# Patient Record
Sex: Male | Born: 1985 | Race: Black or African American | Hispanic: No | Marital: Single | Smoking: Current every day smoker
Health system: Southern US, Community
[De-identification: ages and names within clinical notes are randomized; demographics above are authoritative.]

## PROBLEM LIST (undated history)

## (undated) DIAGNOSIS — S065X9A Traumatic subdural hemorrhage with loss of consciousness of unspecified duration, initial encounter: Secondary | ICD-10-CM

## (undated) DIAGNOSIS — M868X8 Other osteomyelitis, other site: Secondary | ICD-10-CM

## (undated) DIAGNOSIS — F1020 Alcohol dependence, uncomplicated: Secondary | ICD-10-CM

## (undated) DIAGNOSIS — R569 Unspecified convulsions: Secondary | ICD-10-CM

---
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
## 2017-04-20 HISTORY — PX: OTHER SURGICAL HISTORY: SHX169

## 2017-08-18 DIAGNOSIS — M868X8 Other osteomyelitis, other site: Secondary | ICD-10-CM

## 2017-08-18 HISTORY — DX: Other osteomyelitis, other site: M86.8X8

## 2017-09-18 DIAGNOSIS — S065XAA Traumatic subdural hemorrhage with loss of consciousness status unknown, initial encounter: Secondary | ICD-10-CM

## 2017-09-18 DIAGNOSIS — S065X9A Traumatic subdural hemorrhage with loss of consciousness of unspecified duration, initial encounter: Secondary | ICD-10-CM

## 2017-09-18 HISTORY — DX: Traumatic subdural hemorrhage with loss of consciousness of unspecified duration, initial encounter: S06.5X9A

## 2017-09-18 HISTORY — DX: Traumatic subdural hemorrhage with loss of consciousness status unknown, initial encounter: S06.5XAA

## 2017-10-28 DIAGNOSIS — F4310 Post-traumatic stress disorder, unspecified: Secondary | ICD-10-CM | POA: Diagnosis present

## 2017-10-28 DIAGNOSIS — F1020 Alcohol dependence, uncomplicated: Secondary | ICD-10-CM | POA: Insufficient documentation

## 2017-10-28 DIAGNOSIS — G4700 Insomnia, unspecified: Secondary | ICD-10-CM | POA: Insufficient documentation

## 2017-10-28 DIAGNOSIS — R519 Headache, unspecified: Secondary | ICD-10-CM | POA: Insufficient documentation

## 2017-10-28 DIAGNOSIS — F411 Generalized anxiety disorder: Secondary | ICD-10-CM | POA: Diagnosis present

## 2017-10-28 DIAGNOSIS — R569 Unspecified convulsions: Secondary | ICD-10-CM | POA: Insufficient documentation

## 2017-11-26 DIAGNOSIS — D500 Iron deficiency anemia secondary to blood loss (chronic): Secondary | ICD-10-CM | POA: Insufficient documentation

## 2017-11-26 DIAGNOSIS — S065XAA Traumatic subdural hemorrhage with loss of consciousness status unknown, initial encounter: Secondary | ICD-10-CM | POA: Insufficient documentation

## 2017-11-26 DIAGNOSIS — S065X9A Traumatic subdural hemorrhage with loss of consciousness of unspecified duration, initial encounter: Secondary | ICD-10-CM | POA: Insufficient documentation

## 2018-02-28 DIAGNOSIS — F3341 Major depressive disorder, recurrent, in partial remission: Secondary | ICD-10-CM | POA: Insufficient documentation

## 2018-09-13 ENCOUNTER — Encounter (HOSPITAL_COMMUNITY): Payer: Self-pay

## 2018-09-13 ENCOUNTER — Other Ambulatory Visit: Payer: Self-pay

## 2018-09-13 ENCOUNTER — Emergency Department (HOSPITAL_COMMUNITY)
Admission: EM | Admit: 2018-09-13 | Discharge: 2018-09-13 | Discharge status: Against Medical Advice | Payer: 59 | Attending: Emergency Medicine | Admitting: Emergency Medicine

## 2018-09-13 DIAGNOSIS — Z5321 Procedure and treatment not carried out due to patient leaving prior to being seen by health care provider: Secondary | ICD-10-CM | POA: Insufficient documentation

## 2018-09-13 DIAGNOSIS — F1020 Alcohol dependence, uncomplicated: Secondary | ICD-10-CM | POA: Diagnosis present

## 2018-09-13 HISTORY — DX: Alcohol dependence, uncomplicated: F10.20

## 2018-09-13 NOTE — ED Notes (Signed)
Called patient again, no answer

## 2018-09-13 NOTE — ED Notes (Signed)
Called for updating vitals x3 no response.

## 2018-09-13 NOTE — ED Triage Notes (Signed)
Pt arrives POV for eval of ETOH detox. Pt reports he was clean for 4 mos, relapsed last Thursday and has been drinking a handle of whiskey a day. Pt reports hx of same, denies SI/HI, states he just wishes to get clean again. Steady gait, NARD in triage

## 2018-09-14 ENCOUNTER — Other Ambulatory Visit: Payer: Self-pay

## 2018-09-14 ENCOUNTER — Emergency Department (HOSPITAL_COMMUNITY)
Admission: EM | Admit: 2018-09-14 | Discharge: 2018-09-14 | Disposition: A | Discharge status: Home / Self Care | Payer: 59 | Attending: Emergency Medicine | Admitting: Emergency Medicine

## 2018-09-14 ENCOUNTER — Encounter (HOSPITAL_COMMUNITY): Payer: Self-pay

## 2018-09-14 DIAGNOSIS — F17210 Nicotine dependence, cigarettes, uncomplicated: Secondary | ICD-10-CM | POA: Diagnosis not present

## 2018-09-14 DIAGNOSIS — F1010 Alcohol abuse, uncomplicated: Secondary | ICD-10-CM | POA: Insufficient documentation

## 2018-09-14 HISTORY — DX: Unspecified convulsions: R56.9

## 2018-09-14 MED ORDER — ONDANSETRON 4 MG PO TBDP
4.0000 mg | ORAL_TABLET | Freq: Once | ORAL | Status: AC
  Administered 2018-09-14: 4 mg via ORAL
  Filled 2018-09-14: qty 1

## 2018-09-14 MED ORDER — FAMOTIDINE 20 MG PO TABS: 40.0000 mg | ORAL_TABLET | Freq: Two times a day (BID) | ORAL | 0 refills | Status: DC

## 2018-09-14 MED ORDER — CHLORDIAZEPOXIDE HCL 25 MG PO CAPS: ORAL_CAPSULE | ORAL | 0 refills | Status: DC

## 2018-09-14 MED ORDER — FAMOTIDINE 20 MG PO TABS
40.0000 mg | ORAL_TABLET | Freq: Once | ORAL | Status: AC
  Administered 2018-09-14: 13:00:00 40 mg via ORAL
  Filled 2018-09-14: qty 2

## 2018-09-14 MED ORDER — ONDANSETRON 4 MG PO TBDP: 4.0000 mg | ORAL_TABLET | ORAL | 0 refills | Status: DC | PRN

## 2018-09-14 NOTE — ED Triage Notes (Signed)
Patient reports that he was in recovery from alcohol and started drinking x 3 days. Patient reports that he drank 5-6 bottles of [place] rose and bottle of whiskey within that 3 day period. patient reports that he began having pain on the right side of his head and began drinking to get rid of the pain. Patient reports that he has had a seizure when withdrawing from alcohol Patient reports that he feels like he can hear his heart throbbing and hearing sounds.

## 2018-09-14 NOTE — ED Provider Notes (Signed)
[name]-EMERGENCY DEPT Provider Note   CSN: [id number] Arrival date & time: 09/14/18  1229    History   Chief Complaint Chief Complaint  Patient presents with  . Alcohol Intoxication    HPI Eric Clark is a 33 y.o. male.     HPI Patient reports he had been sober for several years after successful treatment at [facility].  He reports after all of the quarantine and stress around recent events, he started drinking very heavily about 3 to 5 days ago.  He reports that he has history of getting alcohol withdrawal symptoms.  He reports today he just did not feel like he could keep doing this with the drinking.  He reports that he stopped but now he is got dry heaves and feels extremely nauseated.  Denies he has abdominal pain but it is hard for him to eat or drink right now.  He reports he feels kind of tremulous and feeling as if he might go on to have a seizure.  He reports he had an old prescription for Keppra and he took 1 today.  We tried to clarify the Keppra prescription because he indicates it was because he had alcohol withdrawal seizures.  (I suspect this was after his subdural hematoma as seizure prophylaxis rather than alcohol withdrawal seizure prophylaxis.  He has not been taking it regularly for quite some time and brought out an older prescription because he felt like he might be going into withdrawal and would be susceptible to getting a seizure).  Patient denies he is having any actual abdominal pain.  He reports this just intense nausea.  He denies he is having headache or confusion.  Patient denies any suicidal or homicidal thoughts.  He reports however, he is worried that he will end up killing himself by alcohol if he keeps drinking.  He feels like he can quit again on his own because he has not been drinking that long again and this was a relapsed due to situational problems. Past Medical History:  Diagnosis Date  . Alcoholism (HCC)   .  Seizures (HCC)   . Subdural hematoma (HCC)     There are no active problems to display for this patient.   Past Surgical History:  Procedure Laterality Date  . subdural hematoma removed          Home Medications    Prior to Admission medications   Medication Sig Start Date End Date Taking? Authorizing Provider  chlordiazePOXIDE (LIBRIUM) 25 MG capsule  PO TID x 1D, then 25-50mg  PO BID X 1D, then 25-50mg  PO QD X 1D 09/14/18   Arby Barrette, MD  famotidine (PEPCID) 20 MG tablet Take 2 tablets (40 mg total) by mouth 2 (two) times daily. 09/14/18   Arby Barrette, MD  ondansetron (ZOFRAN ODT) 4 MG disintegrating tablet Take 1 tablet (4 mg total) by mouth every 4 (four) hours as needed for nausea or vomiting. 09/14/18   Arby Barrette, MD    Family History Family History  Problem Relation Age of Onset  . Diabetes Mother   . Hypertension Father     Social History Social History   Tobacco Use  . Smoking status: Current Every Day Smoker    Packs/day: 0.50    Types: Cigarettes  . Smokeless tobacco: Never Used  Substance Use Topics  . Alcohol use: Yes  . Drug use: Not Currently     Allergies   Patient has no known allergies.   Review of  Systems Review of Systems 10 Systems reviewed and are negative for acute change except as noted in the HPI.   Physical Exam Updated Vital Signs BP (!) 127/100   Pulse (!) 102   Temp 98.2 F (36.8 C) (Oral)   Resp 20   Ht 5\' 11"  (1.803 m)   Wt 63.5 kg   SpO2 98%   BMI 19.53 kg/m   Physical Exam Constitutional:      Comments: Patient is alert and appropriate.  He is sitting in the chair in the room.  He is not agitated or tremulous.  Respirations are, nonlabored.  HENT:     Head: Normocephalic and atraumatic.     Nose: Nose normal.     Mouth/Throat:     Mouth: Mucous membranes are moist.     Pharynx: Oropharynx is clear.  Eyes:     Extraocular Movements: Extraocular movements intact.     Pupils: Pupils are equal,  round, and reactive to light.  Neck:     Musculoskeletal: Neck supple.  Cardiovascular:     Rate and Rhythm: Normal rate and regular rhythm.     Pulses: Normal pulses.  Pulmonary:     Effort: Pulmonary effort is normal.     Breath sounds: Normal breath sounds.  Abdominal:     General: There is no distension.     Palpations: Abdomen is soft.     Tenderness: There is no abdominal tenderness. There is no guarding.  Musculoskeletal: Normal range of motion.        General: No swelling or tenderness.     Right lower leg: No edema.     Left lower leg: No edema.  Skin:    General: Skin is warm and dry.  Neurological:     Comments: Patient speech is normal.  It is clear with normal content.  He is calm in appearance.  Patient does not seem agitated and is not exhibiting a tremor.  No tremor with outstretched hands.  His movements are coordinated purposeful and symmetric.  Psychiatric:        Mood and Affect: Mood normal.      ED Treatments / Results  Labs (all labs ordered are listed, but only abnormal results are displayed) Labs Reviewed - No data to display  EKG None  Radiology No results found.  Procedures Procedures (including critical care time)  Medications Ordered in ED Medications  famotidine (PEPCID) tablet 40 mg (has no administration in time range)  ondansetron (ZOFRAN-ODT) disintegrating tablet 4 mg (has no administration in time range)     Initial Impression / Assessment and Plan / ED Course  I have reviewed the triage vital signs and the nursing notes.  Pertinent labs & imaging results that were available during my care of the patient were reviewed by me and considered in my medical decision making (see chart for details).       Patient is alert and appropriate.  He is not showing signs of active delirium tremens.  He reports however history of seizures with alcohol withdrawal.  Patient intends to quit drinking again.  He had a several day binge relapse  after long period of sobriety.  At this time, he appears stable to continue outpatient treatment.  He reports he has taken Librium before.  He is instructed on taking Pepcid and Zofran for nausea and suspected gastritis.  Patient is encouraged to use the resource guide to get formal treatment again and also he is advised to follow-up with his  PCP, which he reports he will do.  Return precautions reviewed.  Final Clinical Impressions(s) / ED Diagnoses   Final diagnoses:  ETOH abuse    ED Discharge Orders         Ordered    famotidine (PEPCID) 20 MG tablet  2 times daily     09/14/18 1313    chlordiazePOXIDE (LIBRIUM) 25 MG capsule     09/14/18 1313    ondansetron (ZOFRAN ODT) 4 MG disintegrating tablet  Every 4 hours PRN     09/14/18 1313           Arby Barrette, MD 09/14/18 1322

## 2018-09-14 NOTE — ED Notes (Signed)
Bed: [facility]3 Expected date:  Expected time:  Means of arrival:  Comments: 

## 2018-10-10 ENCOUNTER — Emergency Department (HOSPITAL_COMMUNITY): Payer: 59

## 2018-10-10 ENCOUNTER — Emergency Department (HOSPITAL_COMMUNITY)
Admission: EM | Admit: 2018-10-10 | Discharge: 2018-10-10 | Disposition: A | Discharge status: Home / Self Care | Payer: 59 | Attending: Emergency Medicine | Admitting: Emergency Medicine

## 2018-10-10 ENCOUNTER — Other Ambulatory Visit: Payer: Self-pay

## 2018-10-10 ENCOUNTER — Encounter (HOSPITAL_COMMUNITY): Payer: Self-pay | Admitting: Emergency Medicine

## 2018-10-10 DIAGNOSIS — Z79899 Other long term (current) drug therapy: Secondary | ICD-10-CM | POA: Insufficient documentation

## 2018-10-10 DIAGNOSIS — R51 Headache: Secondary | ICD-10-CM | POA: Insufficient documentation

## 2018-10-10 DIAGNOSIS — Z8679 Personal history of other diseases of the circulatory system: Secondary | ICD-10-CM | POA: Diagnosis not present

## 2018-10-10 DIAGNOSIS — F17210 Nicotine dependence, cigarettes, uncomplicated: Secondary | ICD-10-CM | POA: Insufficient documentation

## 2018-10-10 DIAGNOSIS — R519 Headache, unspecified: Secondary | ICD-10-CM

## 2018-10-10 MED ORDER — SODIUM CHLORIDE 0.9 % IV BOLUS
1000.0000 mL | Freq: Once | INTRAVENOUS | Status: AC
  Administered 2018-10-10: 1000 mL via INTRAVENOUS

## 2018-10-10 MED ORDER — SODIUM CHLORIDE 0.9 % IV BOLUS
1000.0000 mL | Freq: Once | INTRAVENOUS | Status: AC
  Administered 2018-10-10: 11:00:00 1000 mL via INTRAVENOUS

## 2018-10-10 MED ORDER — KETOROLAC TROMETHAMINE 30 MG/ML IJ SOLN
30.0000 mg | Freq: Once | INTRAMUSCULAR | Status: AC
  Administered 2018-10-10: 30 mg via INTRAVENOUS
  Filled 2018-10-10: qty 1

## 2018-10-10 NOTE — ED Notes (Signed)
Patient verbalizes understanding of discharge instructions. Opportunity for questioning and answers were provided. Armband removed by staff, pt discharged from ED.  

## 2018-10-10 NOTE — Discharge Instructions (Addendum)
Increase your fluid intake.  You can take Motrin and Tylenol for the headache.  Your head CT did not show any abnormality at this time.

## 2018-10-10 NOTE — ED Triage Notes (Signed)
BIB EMS from home. Pt reports HA onset 0100 with nausea. Pt has hx of migraines & subdural hematoma. No neuro deficits.

## 2018-10-10 NOTE — ED Provider Notes (Signed)
[facility] EMERGENCY DEPARTMENT Provider Note   CSN: [id number] Arrival date & time: 10/10/18  0555     History   Chief Complaint Chief Complaint  Patient presents with  . Headache    HPI Arnie Clingenpeel is a 33 y.o. male.     HPI Patient presents to the emergency department with headache that started last night.  The patient states that he is an alcoholic and has been in recovery but then over the last few days has been drinking large quantities of very inexpensive wine.  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  The patient states that nothing seems make the condition better.  The patient states it feels like a pulling tightness towards the back of his scalp and neck area.  Patient states that he did not take any medications prior to arrival for his symptoms.  Patient states that he has had headaches similar in the past.  The patient was concerned that this could be related to the heavy alcohol consumption. Past Medical History:  Diagnosis Date  . Alcoholism ([place])   . Seizures ([place])   . Subdural hematoma (HCC)     There are no active problems to display for this patient.   Past Surgical History:  Procedure Laterality Date  . subdural hematoma removed          Home Medications    Prior to Admission medications   Medication Sig Start Date End Date Taking? Authorizing Provider  chlordiazePOXIDE (LIBRIUM) 25 MG capsule 50mg  PO TID x 1D, then 25-50mg  PO BID X 1D, then 25-50mg  PO QD X 1D 09/14/18   Charlesetta Shanks, MD  famotidine (PEPCID) 20 MG tablet Take 2 tablets (40 mg total) by mouth 2 (two) times daily. 09/14/18   Charlesetta Shanks, MD  ondansetron (ZOFRAN ODT) 4 MG disintegrating tablet Take 1 tablet (4 mg total) by mouth every 4 (four) hours as needed for nausea or vomiting. 09/14/18   Charlesetta Shanks, MD    Family History Family  History  Problem Relation Age of Onset  . Diabetes Mother   . Hypertension Father     Social History Social History   Tobacco Use  . Smoking status: Current Every Day Smoker    Packs/day: 0.50    Types: Cigarettes  . Smokeless tobacco: Never Used  Substance Use Topics  . Alcohol use: Yes  . Drug use: Not Currently     Allergies   Patient has no known allergies.   Review of Systems Review of Systems All other systems negative except as documented in the HPI. All pertinent positives and negatives as reviewed in the HPI.  Physical Exam Updated Vital Signs BP (!) 121/94   Pulse 68   Temp 98 F (36.7 C) (Oral)   Resp 16   Ht 5\' 11"  (1.803 m)   Wt 64 kg   SpO2 99%   BMI 19.68 kg/m   Physical Exam Vitals signs and nursing note reviewed.  Constitutional:      General: He is not in acute distress.    Appearance: He is well-developed.  HENT:     Head: Normocephalic and atraumatic.  Eyes:     Pupils: Pupils are equal, round, and reactive to light.  Neck:     Musculoskeletal: Normal range of motion and neck supple.  Cardiovascular:     Rate and Rhythm:  Normal rate and regular rhythm.     Heart sounds: Normal heart sounds. No murmur. No friction rub. No gallop.   Pulmonary:     Effort: Pulmonary effort is normal. No respiratory distress.     Breath sounds: Normal breath sounds. No wheezing.  Abdominal:     General: Bowel sounds are normal. There is no distension.     Palpations: Abdomen is soft.     Tenderness: There is no abdominal tenderness.  Skin:    General: Skin is warm and dry.     Capillary Refill: Capillary refill takes less than 2 seconds.     Findings: No erythema or rash.  Neurological:     Mental Status: He is alert and oriented to person, place, and time.     GCS: GCS eye subscore is 4. GCS verbal subscore is 5. GCS motor subscore is 6.     Sensory: No sensory deficit.     Motor: No weakness or abnormal muscle tone.     Coordination:  Coordination normal.     Gait: Gait normal.  Psychiatric:        Behavior: Behavior normal.      ED Treatments / Results  Labs (all labs ordered are listed, but only abnormal results are displayed) Labs Reviewed - No data to display  EKG    Radiology Ct Head Wo Contrast  Result Date: 10/10/2018 CLINICAL DATA:  33 year old male with headache and nausea since 0100 hours. History of subdural hematoma. EXAM: CT HEAD WITHOUT CONTRAST TECHNIQUE: Contiguous axial images were obtained from the base of the skull through the vertex without intravenous contrast. COMPARISON:  None. FINDINGS: Brain: Relatively normal cerebral volume. No encephalomalacia identified underlying the right side craniotomy. No midline shift, ventriculomegaly, mass effect, evidence of mass lesion, intracranial hemorrhage or evidence of cortically based acute infarction. Gray-white matter differentiation is within normal limits throughout the brain. Vascular: Minimal Calcified atherosclerosis at the skull base. No suspicious intracranial vascular hyperdensity. Skull: Previous right side craniotomy. No acute osseous abnormality identified. Sinuses/Orbits: Visualized paranasal sinuses and mastoids are clear. Other: Visualized orbit soft tissues are within normal limits. No acute scalp soft tissue finding. IMPRESSION: Previous right side craniotomy. Otherwise negative noncontrast head CT. Electronically Signed   By: Odessa FlemingH  Hall M.D.   On: 10/10/2018 08:01    Procedures Procedures (including critical care time)  Medications Ordered in ED Medications  sodium chloride 0.9 % bolus 1,000 mL (1,000 mLs Intravenous New Bag/Given 10/10/18 0927)  ketorolac (TORADOL) 30 MG/ML injection 30 mg (30 mg Intravenous Given 10/10/18 [id number])     Initial Impression / Assessment and Plan / ED Course  I have reviewed the triage vital signs and the nursing notes.  Pertinent labs & imaging results that were available during my care of the patient were  reviewed by me and considered in my medical decision making (see chart for details).        Patient has no neurological abnormalities on examination.  The patient's headache could be multifactorial but most likely is related to the heavy alcohol consumption.  The patient has been treated with IV fluids along with Toradol here in the emergency department.  Patient is advised to return here for any worsening his condition.  The patient's head CT did not show any abnormality at this time.  Final Clinical Impressions(s) / ED Diagnoses   Final diagnoses:  None    ED Discharge Orders    None       Mairead Schwarzkopf, Cristal Deerhristopher, PA-C  10/10/18 1143    Arby BarrettePfeiffer, Marcy, MD 10/19/18 [id number]

## 2018-10-19 ENCOUNTER — Emergency Department (HOSPITAL_COMMUNITY): Payer: 59

## 2018-10-19 ENCOUNTER — Other Ambulatory Visit: Payer: Self-pay

## 2018-10-19 ENCOUNTER — Emergency Department (HOSPITAL_COMMUNITY)
Admission: EM | Admit: 2018-10-19 | Discharge: 2018-10-19 | Disposition: A | Discharge status: Home / Self Care | Payer: 59 | Attending: Emergency Medicine | Admitting: Emergency Medicine

## 2018-10-19 ENCOUNTER — Encounter (HOSPITAL_COMMUNITY): Payer: Self-pay | Admitting: *Deleted

## 2018-10-19 DIAGNOSIS — Z8669 Personal history of other diseases of the nervous system and sense organs: Secondary | ICD-10-CM | POA: Diagnosis not present

## 2018-10-19 DIAGNOSIS — F1010 Alcohol abuse, uncomplicated: Secondary | ICD-10-CM | POA: Diagnosis not present

## 2018-10-19 DIAGNOSIS — R51 Headache: Secondary | ICD-10-CM | POA: Diagnosis not present

## 2018-10-19 DIAGNOSIS — F17210 Nicotine dependence, cigarettes, uncomplicated: Secondary | ICD-10-CM | POA: Diagnosis not present

## 2018-10-19 DIAGNOSIS — R519 Headache, unspecified: Secondary | ICD-10-CM

## 2018-10-19 DIAGNOSIS — Z79899 Other long term (current) drug therapy: Secondary | ICD-10-CM | POA: Insufficient documentation

## 2018-10-19 LAB — BASIC METABOLIC PANEL
Anion gap: 9 (ref 5–15)
BUN: 5 mg/dL — ABNORMAL LOW (ref 6–20)
CO2: 25 mmol/L (ref 22–32)
Calcium: 9.1 mg/dL (ref 8.9–10.3)
Chloride: 106 mmol/L (ref 98–111)
Creatinine, Ser: 0.76 mg/dL (ref 0.61–1.24)
GFR calc Af Amer: >60 mL/min (ref >60)
GFR calc non Af Amer: >60 mL/min (ref >60)
Glucose, Bld: 87 mg/dL (ref 70–99)
Potassium: 3.7 mmol/L (ref 3.5–5.1)
Sodium: 140 mmol/L (ref 135–145)

## 2018-10-19 LAB — CBC WITH DIFFERENTIAL/PLATELET
Abs Immature Granulocytes: 0.02 10*3/uL (ref 0.00–0.07)
Basophils Absolute: 0.1 10*3/uL (ref 0.0–0.1)
Basophils Relative: 1 %
Eosinophils Absolute: 0 10*3/uL (ref 0.0–0.5)
Eosinophils Relative: 1 %
HCT: 41.1 % (ref 39.0–52.0)
Hemoglobin: 14.4 g/dL (ref 13.0–17.0)
Immature Granulocytes: 0 %
Lymphocytes Relative: 38 %
Lymphs Abs: 2 10*3/uL (ref 0.7–4.0)
MCH: 28.4 pg (ref 26.0–34.0)
MCHC: 35 g/dL (ref 30.0–36.0)
MCV: 81.1 fL (ref 80.0–100.0)
Monocytes Absolute: 0.5 10*3/uL (ref 0.1–1.0)
Monocytes Relative: 9 %
Neutro Abs: 2.8 10*3/uL (ref 1.7–7.7)
Neutrophils Relative %: 51 %
Platelets: 207 10*3/uL (ref 150–400)
RBC: 5.07 MIL/uL (ref 4.22–5.81)
RDW: 18 % — ABNORMAL HIGH (ref 11.5–15.5)
WBC: 5.4 10*3/uL (ref 4.0–10.5)
nRBC: 0 % (ref 0.0–0.2)

## 2018-10-19 MED ORDER — DIPHENHYDRAMINE HCL 50 MG/ML IJ SOLN
12.5000 mg | Freq: Once | INTRAMUSCULAR | Status: AC
  Administered 2018-10-19: 12.5 mg via INTRAVENOUS
  Filled 2018-10-19: qty 1

## 2018-10-19 MED ORDER — METOCLOPRAMIDE HCL 5 MG/ML IJ SOLN
10.0000 mg | Freq: Once | INTRAMUSCULAR | Status: AC
  Administered 2018-10-19: 10 mg via INTRAVENOUS
  Filled 2018-10-19: qty 2

## 2018-10-19 MED ORDER — KETOROLAC TROMETHAMINE 30 MG/ML IJ SOLN
30.0000 mg | Freq: Once | INTRAMUSCULAR | Status: AC
  Administered 2018-10-19: 30 mg via INTRAVENOUS
  Filled 2018-10-19: qty 1

## 2018-10-19 MED ORDER — SODIUM CHLORIDE 0.9 % IV BOLUS
1000.0000 mL | Freq: Once | INTRAVENOUS | Status: AC
  Administered 2018-10-19: 1000 mL via INTRAVENOUS

## 2018-10-19 NOTE — ED Notes (Signed)
Patient transported to CT 

## 2018-10-19 NOTE — ED Notes (Signed)
ED Provider at bedside. 

## 2018-10-19 NOTE — ED Triage Notes (Signed)
Pt arrived by [facility] for headache. Has hx of migraines and subdural hematoma in past.  Having headache x 2 days with nausea. Has not taken any otc meds pta.

## 2018-10-19 NOTE — ED Provider Notes (Signed)
33 year old man history of subdural hematoma and alcohol abuse presents today with headache and discontinuation of alcohol since Monday.  Evaluation here reveals normal neurological exam.  He had a head CT performed that shows no evidence of new bleeding.  Vital signs are stable with heart rate of 67 and blood pressure on my exam of 102/60.  Discussed need for follow-up and patient states he has a neurology appointment in early August.  He also discussed alcohol withdrawal signs and symptoms and need for return.  Patient voices understanding of above and appears stable for discharge   Pattricia Boss, MD 10/19/18 [phone]

## 2018-10-19 NOTE — ED Provider Notes (Signed)
[facility] Provider Note   CSN: [id number] Arrival date & time: 10/19/18  1048  History   Chief Complaint Chief Complaint  Patient presents with  . Headache    HPI Eric Clark is a 33 y.o. male with past medical history significant for alcoholism, seizures, spontaneous subdural hematoma s/p craniotomy who presents for evaluation of headache.  Patient states he has had a headache x4 days.  Patient states he is recovering alcoholic however has been drinking alcohol over the last month.  States he drinks approximately 1 bottle of "cheap wine" a day.  Patient states he is not any alcohol to drink today.  Last alcohol use 24 hours ago.  Denies history of DTs or withdrawal seizures.  Patient states his history of seizures was due to his subarachnoid hemorrhage.  He is currently on Keppra.  He has not missed any doses.  States he did have a mechanical fall 2 days ago where he hit the right side of his head.  He is not sure if his headache started after his fall.  He denies sudden onset thunderclap headache.  Patient states overall since his craniotomy he has been suffering from migraine headaches as well as tension headaches.  He has not taken anything for his pain.  He rates his pain a 5/10.  Pain does not radiate.  Denies vision changes, unilateral weakness, slurred speech, chest pain, shortness of breath, abdominal pain, diarrhea, the patient, melena, hematochezia, hematemesis, cough, neck pain or neck stiffness, dysuria, dizziness, lightheadedness, syncope, tremors.  Patient states he cannot remember the last time he had a seizure however "it is been a long time.".  Patient states over the last month he is also had generalized fatigue.  Headache non-positional in nature.  No recent spinal infusions.  History obtained from patient and past medical records.  No interpreter was used.     HPI  Past Medical History:  Diagnosis Date  . Alcoholism (HCC)   .  Seizures (HCC)   . Subdural hematoma (HCC)     There are no active problems to display for this patient.   Past Surgical History:  Procedure Laterality Date  . subdural hematoma removed          Home Medications    Prior to Admission medications   Medication Sig Start Date End Date Taking? Authorizing Provider  chlordiazePOXIDE (LIBRIUM) 25 MG capsule 50mg  PO TID x 1D, then 25-50mg  PO BID X 1D, then 25-50mg  PO QD X 1D 09/14/18  Yes Pfeiffer, Lebron ConnersMarcy, MD  gabapentin (NEURONTIN) 300 MG capsule Take 300 mg by mouth 3 (three) times daily. 09/14/18  Yes [provider]  levETIRAcetam (KEPPRA) 500 MG tablet Take 500 mg by mouth 2 (two) times daily.   Yes [provider]  famotidine (PEPCID) 20 MG tablet Take 2 tablets (40 mg total) by mouth 2 (two) times daily. Patient not taking: Reported on 10/19/2018 09/14/18   Arby BarrettePfeiffer, Marcy, MD  ondansetron (ZOFRAN ODT) 4 MG disintegrating tablet Take 1 tablet (4 mg total) by mouth every 4 (four) hours as needed for nausea or vomiting. Patient not taking: Reported on 10/19/2018 09/14/18   Arby BarrettePfeiffer, Marcy, MD    Family History Family History  Problem Relation Age of Onset  . Diabetes Mother   . Hypertension Father     Social History Social History   Tobacco Use  . Smoking status: Current Every Day Smoker    Packs/day: 0.50    Types: Cigarettes  .  Smokeless tobacco: Never Used  Substance Use Topics  . Alcohol use: Yes  . Drug use: Not Currently     Allergies   Shellfish allergy and Sunflower oil   Review of Systems Review of Systems  Constitutional: Negative.   HENT: Negative.   Respiratory: Negative.   Cardiovascular: Negative.   Gastrointestinal: Negative.   Genitourinary: Negative.   Musculoskeletal: Negative.   Skin: Negative.   Neurological: Positive for headaches. Negative for dizziness, tremors, seizures, syncope, facial asymmetry, speech difficulty, weakness, light-headedness and numbness.  All other  systems reviewed and are negative.    Physical Exam Updated Vital Signs BP 123/85   Pulse 67   Temp 98.5 F (36.9 C) (Oral)   Resp 14   SpO2 100%   Physical Exam  Physical Exam  Constitutional: Pt is oriented to person, place, and time. Pt appears well-developed and well-nourished. No distress.  HENT:  Head: Normocephalic and atraumatic.  Mouth/Throat: Oropharynx is clear and moist.  Eyes: Conjunctivae and EOM are normal. Pupils are equal, round, and reactive to light. No scleral icterus.  No horizontal, vertical or rotational nystagmus  Neck: Normal range of motion. Neck supple.  Full active and passive ROM without pain No midline or paraspinal tenderness No nuchal rigidity or meningeal signs  Cardiovascular: Normal rate, regular rhythm and intact distal pulses.   Pulmonary/Chest: Effort normal and breath sounds normal. No respiratory distress. Pt has no wheezes. No rales.  Abdominal: Soft. Bowel sounds are normal. There is no tenderness. There is no rebound and no guarding.  Musculoskeletal: Normal range of motion.  Lymphadenopathy:    No cervical adenopathy.  Neurological: Pt. is alert and oriented to person, place, and time. He has normal reflexes. No cranial nerve deficit.  Exhibits normal muscle tone. Coordination normal.  Mental Status:  Alert, oriented, thought content appropriate. Speech fluent without evidence of aphasia. Able to follow 2 step commands without difficulty.  Cranial Nerves:  II:  Peripheral visual fields grossly normal, pupils equal, round, reactive to light III,IV, VI: ptosis not present, extra-ocular motions intact bilaterally  V,VII: smile symmetric, facial light touch sensation equal VIII: hearing grossly normal bilaterally  IX,X: midline uvula rise  XI: bilateral shoulder shrug equal and strong XII: midline tongue extension  Motor:  5/5 in upper and lower extremities bilaterally including strong and equal grip strength and dorsiflexion/plantar  flexion Sensory: Pinprick and light touch normal in all extremities.  Deep Tendon Reflexes: 2+ and symmetric  Cerebellar: normal finger-to-nose with bilateral upper extremities Gait: normal gait and balance CV: distal pulses palpable throughout   Skin: Skin is warm and dry. No rash noted. Pt is not diaphoretic.  Psychiatric: Pt has a normal mood and affect. Behavior is normal. Judgment and thought content normal.  Nursing note and vitals reviewed. ED Treatments / Results  Labs (all labs ordered are listed, but only abnormal results are displayed) Labs Reviewed  CBC WITH DIFFERENTIAL/PLATELET - Abnormal; Notable for the following components:      Result Value   RDW 18.0 (*)    All other components within normal limits  BASIC METABOLIC PANEL - Abnormal; Notable for the following components:   BUN 5 (*)    All other components within normal limits    EKG EKG Interpretation  Date/Time:  Wednesday October 19 2018 11:21:16 EDT Ventricular Rate:  73 PR Interval:    QRS Duration: 110 QT Interval:  367 QTC Calculation: 405 R Axis:   -12 Text Interpretation:  Normal sinus rhythm No  significant change since last tracing 10/10/2018 Confirmed by Margarita Grizzleay, Danielle [phone]([id number]) on 10/19/2018 11:26:12 AM Also confirmed by Margarita Grizzleay, Danielle [phone]), editor Elita QuickWatlington, [place] ([id number])  on 10/19/2018 4:06:47 PM   Radiology Ct Head Wo Contrast  Result Date: 10/19/2018 CLINICAL DATA:  Right-sided headache, history of subdural hemorrhage EXAM: CT HEAD WITHOUT CONTRAST TECHNIQUE: Contiguous axial images were obtained from the base of the skull through the vertex without intravenous contrast. COMPARISON:  10/10/2018 FINDINGS: Brain: No evidence of acute infarction, hemorrhage, hydrocephalus, extra-axial collection or mass lesion/mass effect. Vascular: No hyperdense vessel or unexpected calcification. Skull: Redemonstrated postoperative findings of prior right frontoparietal craniotomy. Negative for fracture or focal lesion.  Sinuses/Orbits: No acute finding. Other: None. IMPRESSION: No acute intracranial pathology. Redemonstrated postoperative findings of prior right frontoparietal craniotomy. Electronically Signed   By: Lauralyn PrimesAlex  Bibbey M.D.   On: 10/19/2018 13:03    Procedures Procedures (including critical care time)  Medications Ordered in ED Medications  sodium chloride 0.9 % bolus 1,000 mL (0 mLs Intravenous Stopped 10/19/18 1448)  ketorolac (TORADOL) 30 MG/ML injection 30 mg (30 mg Intravenous Given 10/19/18 1343)  metoCLOPramide (REGLAN) injection 10 mg (10 mg Intravenous Given 10/19/18 1344)  diphenhydrAMINE (BENADRYL) injection 12.5 mg (12.5 mg Intravenous Given 10/19/18 1343)     Initial Impression / Assessment and Plan / ED Course  I have reviewed the triage vital signs and the nursing notes.  Pertinent labs & imaging results that were available during my care of the patient were reviewed by me and considered in my medical decision making (see chart for details).  33 year old male appears otherwise well presents for evaluation of headache.  History of spontaneous subarachnoid hemorrhage s/p craniotomy.  Patient recovering alcoholic however is been drinking approximately 1 bottle of wine daily over the last month.  He denies history of DTs or withdrawal seizures.  Does have history of seizures which were related to a subarachnoid hemorrhage.  He takes Keppra.  Has not missed any doses.  Did have mechanical fall 4 days ago and hit the right side of his head.  He denies midline cervical or spinal tenderness palpation.  He has a normal musculoskeletal exam.  Normal neurologic he was at exam without neurologic deficits.  He has no dizziness, lightheadedness.  Has had some generalized fatigue over the last month.  Highly suspect fatigue is likely related to his recent alcohol use.  He does not appear in any alcohol withdrawal.  He is not any tachycardia, tachypnea or hypoxia.  No tremors.  Given history with recent fall  and spontaneous subarachnoid hemorrhage will obtain CT to rule out intracranial pathology.  Suspect patient's recurrent headache.  Obtain baseline labs to for electrolyte, anemia as cause of patient's fatigue.  Labs and Imaging personally reviewed--Labs without any acute findings, CT head negative, EKG without ST/T changes, no STEMI.  Presentation is like pts typical HA and non concerning for [facility] LPAH, ICH, Meningitis, or temporal arteritis. Pt is afebrile with no focal neuro deficits, nuchal rigidity, or change in vision. Pt is to follow up with PCP to discuss prophylactic medication.  Patient without any evidence of alcohol withdrawal currently.  Offered Librium taper at dc for possible alcohol withdrawal however patient declined.  The patient has been appropriately medically screened and/or stabilized in the ED. I have low suspicion for any other emergent medical condition which would require further screening, evaluation or treatment in the ED or require inpatient management. Patient is hemodynamically stable and in no acute distress.  Patient able to  ambulate in department prior to ED.  Evaluation does not show acute pathology that would require ongoing or additional emergent interventions while in the emergency department or further inpatient treatment.  I have discussed the diagnosis with the patient and answered all questions.  Pain is been managed while in the emergency department and patient has no further complaints prior to discharge.  Patient is comfortable with plan discussed in room and is stable for discharge at this time.  I have discussed strict return precautions for returning to the emergency department.  Patient was encouraged to follow-up with PCP/specialist refer to at discharge.     Final Clinical Impressions(s) / ED Diagnoses   Final diagnoses:  Acute nonintractable headache, unspecified headache type    ED Discharge Orders    None       Danett Palazzo A, PA-C 10/19/18 2028     Pattricia Boss, MD 10/20/18 1440

## 2018-10-19 NOTE — Discharge Instructions (Signed)
Follow Up with neurology for your recurrent headache.  Return to the ED for any new or worsening symptoms.

## 2018-10-24 ENCOUNTER — Other Ambulatory Visit: Payer: Self-pay

## 2018-10-24 ENCOUNTER — Encounter (HOSPITAL_COMMUNITY): Payer: Self-pay

## 2018-10-24 ENCOUNTER — Emergency Department (HOSPITAL_COMMUNITY)
Admission: EM | Admit: 2018-10-24 | Discharge: 2018-10-25 | Disposition: A | Discharge status: Other Acute Inpatient Hospital | Payer: 59 | Source: Home / Self Care | Attending: Emergency Medicine | Admitting: Emergency Medicine

## 2018-10-24 DIAGNOSIS — Z03818 Encounter for observation for suspected exposure to other biological agents ruled out: Secondary | ICD-10-CM | POA: Insufficient documentation

## 2018-10-24 DIAGNOSIS — F332 Major depressive disorder, recurrent severe without psychotic features: Secondary | ICD-10-CM | POA: Insufficient documentation

## 2018-10-24 DIAGNOSIS — F4310 Post-traumatic stress disorder, unspecified: Secondary | ICD-10-CM | POA: Insufficient documentation

## 2018-10-24 DIAGNOSIS — F1010 Alcohol abuse, uncomplicated: Secondary | ICD-10-CM

## 2018-10-24 DIAGNOSIS — F322 Major depressive disorder, single episode, severe without psychotic features: Secondary | ICD-10-CM | POA: Diagnosis not present

## 2018-10-24 DIAGNOSIS — Z79899 Other long term (current) drug therapy: Secondary | ICD-10-CM | POA: Insufficient documentation

## 2018-10-24 DIAGNOSIS — R519 Headache, unspecified: Secondary | ICD-10-CM

## 2018-10-24 DIAGNOSIS — F411 Generalized anxiety disorder: Secondary | ICD-10-CM | POA: Insufficient documentation

## 2018-10-24 DIAGNOSIS — R51 Headache: Secondary | ICD-10-CM | POA: Insufficient documentation

## 2018-10-24 DIAGNOSIS — F17210 Nicotine dependence, cigarettes, uncomplicated: Secondary | ICD-10-CM | POA: Insufficient documentation

## 2018-10-24 DIAGNOSIS — F1020 Alcohol dependence, uncomplicated: Secondary | ICD-10-CM | POA: Insufficient documentation

## 2018-10-24 LAB — CBC
HCT: 49.6 % (ref 39.0–52.0)
Hemoglobin: 17.3 g/dL — ABNORMAL HIGH (ref 13.0–17.0)
MCH: 28.4 pg (ref 26.0–34.0)
MCHC: 34.9 g/dL (ref 30.0–36.0)
MCV: 81.4 fL (ref 80.0–100.0)
Platelets: 228 10*3/uL (ref 150–400)
RBC: 6.09 MIL/uL — ABNORMAL HIGH (ref 4.22–5.81)
RDW: 18.8 % — ABNORMAL HIGH (ref 11.5–15.5)
WBC: 5.5 10*3/uL (ref 4.0–10.5)
nRBC: 0 % (ref 0.0–0.2)

## 2018-10-24 LAB — COMPREHENSIVE METABOLIC PANEL
ALT: 20 U/L (ref 0–44)
AST: 26 U/L (ref 15–41)
Albumin: 4.7 g/dL (ref 3.5–5.0)
Alkaline Phosphatase: 77 U/L (ref 38–126)
Anion gap: 14 (ref 5–15)
BUN: <5 mg/dL — ABNORMAL LOW (ref 6–20)
CO2: 24 mmol/L (ref 22–32)
Calcium: 9.2 mg/dL (ref 8.9–10.3)
Chloride: 105 mmol/L (ref 98–111)
Creatinine, Ser: 0.94 mg/dL (ref 0.61–1.24)
GFR calc Af Amer: >60 mL/min (ref >60)
GFR calc non Af Amer: >60 mL/min (ref >60)
Glucose, Bld: 97 mg/dL (ref 70–99)
Potassium: 4 mmol/L (ref 3.5–5.1)
Sodium: 143 mmol/L (ref 135–145)
Total Bilirubin: 0.7 mg/dL (ref 0.3–1.2)
Total Protein: 7.6 g/dL (ref 6.5–8.1)

## 2018-10-24 LAB — RAPID URINE DRUG SCREEN, HOSP PERFORMED
Amphetamines: NOT DETECTED
Barbiturates: NOT DETECTED
Benzodiazepines: NOT DETECTED
Cocaine: NOT DETECTED
Opiates: NOT DETECTED
Tetrahydrocannabinol: NOT DETECTED

## 2018-10-24 LAB — ETHANOL: Alcohol, Ethyl (B): 322 mg/dL (ref <10)

## 2018-10-24 MED ORDER — KETOROLAC TROMETHAMINE 30 MG/ML IJ SOLN
30.0000 mg | Freq: Once | INTRAMUSCULAR | Status: AC
  Administered 2018-10-25: 01:00:00 30 mg via INTRAMUSCULAR
  Filled 2018-10-24: qty 1

## 2018-10-24 MED ORDER — DIAZEPAM 2 MG PO TABS
2.0000 mg | ORAL_TABLET | Freq: Once | ORAL | Status: AC
  Administered 2018-10-25: 2 mg via ORAL
  Filled 2018-10-24: qty 1

## 2018-10-24 NOTE — ED Provider Notes (Signed)
[facility] Provider [facility]   CSN: [id number] Arrival date & time: 10/24/18  1829    History   Chief Complaint Chief Complaint  Patient presents with  . Psychiatric Evaluation    HPI Eric Clark is a 33 y.o. male.     33 year old male with history of subdural hematoma status post craniotomy, alcoholism presents to the emergency department for evaluation of persistent right temporal headache.  Patient referenced suicidal ideations in triage, but states that the pain is so severe that he has thoughts of killing himself.  States that he has no history of suicide attempt and no plan on harming himself.  When asked if he would kill himself upon leaving the emergency department, he states that he would not.  Further expresses that he is tired of experiencing this tightening pain in his right temple constantly for 1 year.  Initially had some Robaxin following his craniotomy, but feels this does not help him.  Drinks heavily as he feels this is the only way to relax his muscles.  Typically drinks about 1/5 of whiskey a day or wine.  Last drink was around noon today.  Denies any homicidal ideations, illicit drug use, auditory or visual hallucinations.  The history is provided by the patient. No language interpreter was used.    Past Medical History:  Diagnosis Date  . Alcoholism (HCC)   . Seizures (HCC)   . Subdural hematoma (HCC)     There are no active problems to display for this patient.   Past Surgical History:  Procedure Laterality Date  . subdural hematoma removed          Home Medications    Prior to Admission medications   Medication Sig Start Date End Date Taking? Authorizing Provider  chlordiazePOXIDE (LIBRIUM) 25 MG capsule 50mg  PO TID x 1D, then 25-50mg  PO BID X 1D, then 25-50mg  PO QD X 1D 09/14/18   Arby BarrettePfeiffer, Marcy, MD  famotidine (PEPCID) 20 MG tablet Take 2 tablets (40 mg total) by mouth 2 (two) times daily. Patient not  taking: Reported on 10/19/2018 09/14/18   Arby BarrettePfeiffer, Marcy, MD  gabapentin (NEURONTIN) 300 MG capsule Take 300 mg by mouth 3 (three) times daily. 09/14/18   [provider]  levETIRAcetam (KEPPRA) 500 MG tablet Take 500 mg by mouth 2 (two) times daily.    [provider]  ondansetron (ZOFRAN ODT) 4 MG disintegrating tablet Take 1 tablet (4 mg total) by mouth every 4 (four) hours as needed for nausea or vomiting. Patient not taking: Reported on 10/19/2018 09/14/18   Arby BarrettePfeiffer, Marcy, MD    Family History Family History  Problem Relation Age of Onset  . Diabetes Mother   . Hypertension Father     Social History Social History   Tobacco Use  . Smoking status: Current Every Day Smoker    Packs/day: 0.50    Types: Cigarettes  . Smokeless tobacco: Never Used  Substance Use Topics  . Alcohol use: Yes  . Drug use: Not Currently     Allergies   Shellfish allergy and Sunflower oil   Review of Systems Review of Systems Ten systems reviewed and are negative for acute change, except as noted in the HPI.    Physical Exam Updated Vital Signs BP 121/85 (BP Location: Right Arm)   Pulse 85   Temp 98.3 F (36.8 C) (Oral)   Resp 16   Ht 5\' 11"  (1.803 m)   Wt 72.6 kg   SpO2 98%  BMI 22.32 kg/m   Physical Exam Vitals signs and nursing note reviewed.  Constitutional:      General: He is not in acute distress.    Appearance: He is well-developed. He is not diaphoretic.     Comments: Resting calmly, speech is clear  HENT:     Head: Normocephalic and atraumatic.  Eyes:     General: No scleral icterus.    Conjunctiva/sclera: Conjunctivae normal.  Neck:     Musculoskeletal: Normal range of motion.  Cardiovascular:     Rate and Rhythm: Normal rate and regular rhythm.     Pulses: Normal pulses.  Pulmonary:     Effort: Pulmonary effort is normal. No respiratory distress.     Comments: Respirations even and unlabored Musculoskeletal: Normal range of motion.  Skin:     General: Skin is warm and dry.     Coloration: Skin is not pale.     Findings: No erythema or rash.  Neurological:     Mental Status: He is alert and oriented to person, place, and time.     Coordination: Coordination normal.     Comments: GCS 15.  Patient answers questions appropriately and follows commands.  No slurring of speech.  No focal deficits noted.  Psychiatric:        Behavior: Behavior normal.      ED Treatments / Results  Labs (all labs ordered are listed, but only abnormal results are displayed) Labs Reviewed  COMPREHENSIVE METABOLIC PANEL - Abnormal; Notable for the following components:      Result Value   BUN <5 (*)    All other components within normal limits  ETHANOL - Abnormal; Notable for the following components:   Alcohol, Ethyl (B) 322 (*)    All other components within normal limits  CBC - Abnormal; Notable for the following components:   RBC 6.09 (*)    Hemoglobin 17.3 (*)    RDW 18.8 (*)    All other components within normal limits  SARS CORONAVIRUS 2 ([facility] ORDER, PERFORMED IN [facility])  RAPID URINE DRUG SCREEN, HOSP PERFORMED    EKG None  Radiology No results found.  Procedures Procedures (including critical care time)  Medications Ordered in ED Medications  diazepam (VALIUM) tablet 2 mg (2 mg Oral Given 10/25/18 0113)  ketorolac (TORADOL) 30 MG/ML injection 30 mg (30 mg Intramuscular Given 10/25/18 0114)     Initial Impression / Assessment and Plan / ED Course  I have reviewed the triage vital signs and the nursing notes.  Pertinent labs & imaging results that were available during my care of the patient were reviewed by me and considered in my medical decision making (see chart for details).        4511:2115 PM 33 year old male with a history of alcohol abuse presents to the emergency department for complaints of right temporal headache.  Was evaluated for this a few days ago in the ED with negative head CT and  migraine cocktail.  Has been experiencing chronic right temporal pain since craniotomy 1 year ago.  States the pain is so severe that it makes him have thoughts of harming/killing himself.  Denies being actively suicidal.  Also denies history of suicide attempt.  2:02 AM Patient resting comfortably.  Reports interval improvement in headache with medications.  Continues to deny suicidal or homicidal thoughts.  CIWA is presently a 0.  Does not have anybody to come pick him up from the ED.  Plan for discharge on  the buses begin running this morning.  6:04 AM TTS completed after it was ordered on patient arrival. He has been accepted to [facility] for ongoing psychiatric care with transfer after 8AM. Patient medically cleared for transfer later this AM.  Accepting MD, Dr. Parke Poisson.   Final Clinical Impressions(s) / ED Diagnoses   Final diagnoses:  Severe episode of recurrent major depressive disorder, without psychotic features ([place])  Alcohol abuse  Right temporal headache    ED Discharge Orders    None       Antonietta Breach, PA-C 10/25/18 0606    Merryl Hacker, MD 10/29/18 [phone]

## 2018-10-24 NOTE — ED Triage Notes (Signed)
Pt arrives via [facility] for eval of SI w/ wishes to detox. Pt states that he is having thoughts of hurting himself w/ no specific plan. Pt is calm and cooperative in triage, flat affect. Pt denies HI, states last drink was this AM around noon, typically drinks about 1/5th of whiskey a day or wine.

## 2018-10-24 NOTE — BH Assessment (Signed)
[facility] Assessment Progress Note   Pt BAL was 322 at 19:38.  TTS to see patient when it is closer to 200.  TTS to see patient after midnight.

## 2018-10-25 ENCOUNTER — Other Ambulatory Visit: Payer: Self-pay

## 2018-10-25 ENCOUNTER — Encounter (HOSPITAL_COMMUNITY): Payer: Self-pay

## 2018-10-25 ENCOUNTER — Inpatient Hospital Stay (HOSPITAL_COMMUNITY)
Admission: AD | Admit: 2018-10-25 | Discharge: 2018-10-27 | DRG: 885 | Disposition: A | Discharge status: Home / Self Care | Payer: 59 | Source: Intra-hospital | Attending: Psychiatry | Admitting: Psychiatry

## 2018-10-25 DIAGNOSIS — Y908 Blood alcohol level of 240 mg/100 ml or more: Secondary | ICD-10-CM | POA: Diagnosis present

## 2018-10-25 DIAGNOSIS — F4310 Post-traumatic stress disorder, unspecified: Secondary | ICD-10-CM | POA: Diagnosis present

## 2018-10-25 DIAGNOSIS — Z91018 Allergy to other foods: Secondary | ICD-10-CM | POA: Diagnosis not present

## 2018-10-25 DIAGNOSIS — F322 Major depressive disorder, single episode, severe without psychotic features: Principal | ICD-10-CM | POA: Diagnosis present

## 2018-10-25 DIAGNOSIS — Z91013 Allergy to seafood: Secondary | ICD-10-CM

## 2018-10-25 DIAGNOSIS — R51 Headache: Secondary | ICD-10-CM | POA: Insufficient documentation

## 2018-10-25 DIAGNOSIS — R569 Unspecified convulsions: Secondary | ICD-10-CM | POA: Diagnosis present

## 2018-10-25 DIAGNOSIS — Z79899 Other long term (current) drug therapy: Secondary | ICD-10-CM

## 2018-10-25 DIAGNOSIS — F1020 Alcohol dependence, uncomplicated: Secondary | ICD-10-CM | POA: Diagnosis present

## 2018-10-25 DIAGNOSIS — G4700 Insomnia, unspecified: Secondary | ICD-10-CM | POA: Diagnosis present

## 2018-10-25 DIAGNOSIS — Z20828 Contact with and (suspected) exposure to other viral communicable diseases: Secondary | ICD-10-CM | POA: Diagnosis present

## 2018-10-25 DIAGNOSIS — F411 Generalized anxiety disorder: Secondary | ICD-10-CM | POA: Diagnosis present

## 2018-10-25 LAB — SARS CORONAVIRUS 2 BY RT PCR (HOSPITAL ORDER, PERFORMED IN ~~LOC~~ HOSPITAL LAB): SARS Coronavirus 2: NEGATIVE

## 2018-10-25 MED ORDER — HYDROXYZINE HCL 25 MG PO TABS
25.0000 mg | ORAL_TABLET | Freq: Four times a day (QID) | ORAL | Status: DC | PRN
  Administered 2018-10-26: 25 mg via ORAL
  Filled 2018-10-25: qty 1

## 2018-10-25 MED ORDER — LORAZEPAM 1 MG PO TABS: 1.0000 mg | ORAL_TABLET | Freq: Four times a day (QID) | ORAL | Status: DC | PRN

## 2018-10-25 MED ORDER — LORAZEPAM 1 MG PO TABS
1.0000 mg | ORAL_TABLET | Freq: Four times a day (QID) | ORAL | Status: AC
  Administered 2018-10-25 (×3): 1 mg via ORAL
  Filled 2018-10-25 (×3): qty 1

## 2018-10-25 MED ORDER — LEVETIRACETAM 500 MG PO TABS
500.0000 mg | ORAL_TABLET | Freq: Two times a day (BID) | ORAL | Status: DC
  Administered 2018-10-25 – 2018-10-27 (×5): 500 mg via ORAL
  Filled 2018-10-25 (×9): qty 1

## 2018-10-25 MED ORDER — VITAMIN B-1 100 MG PO TABS: 100.0000 mg | ORAL_TABLET | Freq: Every day | ORAL | Status: DC

## 2018-10-25 MED ORDER — NICOTINE 21 MG/24HR TD PT24
21.0000 mg | MEDICATED_PATCH | Freq: Every day | TRANSDERMAL | Status: DC
  Administered 2018-10-25 – 2018-10-27 (×3): 21 mg via TRANSDERMAL
  Filled 2018-10-25 (×6): qty 1

## 2018-10-25 MED ORDER — VITAMIN B-1 100 MG PO TABS
100.0000 mg | ORAL_TABLET | Freq: Every day | ORAL | Status: DC
  Administered 2018-10-26 – 2018-10-27 (×2): 100 mg via ORAL
  Filled 2018-10-25 (×4): qty 1

## 2018-10-25 MED ORDER — LORAZEPAM 1 MG PO TABS
1.0000 mg | ORAL_TABLET | Freq: Three times a day (TID) | ORAL | Status: AC
  Administered 2018-10-26 (×3): 1 mg via ORAL
  Filled 2018-10-25 (×3): qty 1

## 2018-10-25 MED ORDER — MAGNESIUM HYDROXIDE 400 MG/5ML PO SUSP: 30.0000 mL | Freq: Every day | ORAL | Status: DC | PRN

## 2018-10-25 MED ORDER — LORAZEPAM 1 MG PO TABS: 1.0000 mg | ORAL_TABLET | Freq: Every day | ORAL | Status: DC

## 2018-10-25 MED ORDER — LOPERAMIDE HCL 2 MG PO CAPS: 2.0000 mg | ORAL_CAPSULE | ORAL | Status: DC | PRN

## 2018-10-25 MED ORDER — ADULT MULTIVITAMIN W/MINERALS CH
1.0000 | ORAL_TABLET | Freq: Every day | ORAL | Status: DC
  Administered 2018-10-25 – 2018-10-27 (×3): 1 via ORAL
  Filled 2018-10-25 (×5): qty 1

## 2018-10-25 MED ORDER — ACETAMINOPHEN 325 MG PO TABS: 650.0000 mg | ORAL_TABLET | Freq: Four times a day (QID) | ORAL | Status: DC | PRN

## 2018-10-25 MED ORDER — THIAMINE HCL 100 MG/ML IJ SOLN: 100.0000 mg | Freq: Every day | INTRAMUSCULAR | Status: DC

## 2018-10-25 MED ORDER — LORAZEPAM 1 MG PO TABS: 0.0000 mg | ORAL_TABLET | Freq: Two times a day (BID) | ORAL | Status: DC

## 2018-10-25 MED ORDER — LORAZEPAM 2 MG/ML IJ SOLN: 0.0000 mg | Freq: Four times a day (QID) | INTRAMUSCULAR | Status: DC

## 2018-10-25 MED ORDER — ONDANSETRON 4 MG PO TBDP: 4.0000 mg | ORAL_TABLET | Freq: Four times a day (QID) | ORAL | Status: DC | PRN

## 2018-10-25 MED ORDER — QUETIAPINE FUMARATE 25 MG PO TABS
25.0000 mg | ORAL_TABLET | Freq: Every evening | ORAL | Status: DC | PRN
  Administered 2018-10-25 – 2018-10-26 (×2): 25 mg via ORAL
  Filled 2018-10-25 (×2): qty 1

## 2018-10-25 MED ORDER — LORAZEPAM 2 MG/ML IJ SOLN: 0.0000 mg | Freq: Two times a day (BID) | INTRAMUSCULAR | Status: DC

## 2018-10-25 MED ORDER — LORAZEPAM 1 MG PO TABS
1.0000 mg | ORAL_TABLET | Freq: Two times a day (BID) | ORAL | Status: DC
  Filled 2018-10-25: qty 1

## 2018-10-25 MED ORDER — ALUM & MAG HYDROXIDE-SIMETH 200-200-20 MG/5ML PO SUSP: 30.0000 mL | ORAL | Status: DC | PRN

## 2018-10-25 MED ORDER — LORAZEPAM 1 MG PO TABS: 0.0000 mg | ORAL_TABLET | Freq: Four times a day (QID) | ORAL | Status: DC

## 2018-10-25 NOTE — Progress Notes (Signed)
D: Pt denies SI/HI/AVH. Pt is pleasant and cooperative. Pt stated he was ok  A: Pt was offered support and encouragement. Pt was given scheduled medications. Pt was encourage to attend groups. Q 15 minute checks were done for safety.   R:Pt attends groups and interacts well with peers and staff. Pt is taking medication. Pt has no complaints.Pt receptive to treatment and safety maintained on unit. 

## 2018-10-25 NOTE — Tx Team (Signed)
Initial Treatment Plan 10/25/2018 10:20 AM [name] [id number]    PATIENT STRESSORS: Health problems Substance abuse   PATIENT STRENGTHS: Ability for insight Active sense of humor Average or above average intelligence   PATIENT IDENTIFIED PROBLEMS: "alcohol"  headaches                   DISCHARGE CRITERIA:  Ability to meet basic life and health needs Improved stabilization in mood, thinking, and/or behavior  PRELIMINARY DISCHARGE PLAN: Outpatient therapy  PATIENT/FAMILY INVOLVEMENT: This treatment plan has been presented to and reviewed with the patient, Eric Clark, and/or family member,  The patient and family have been given the opportunity to ask questions and make suggestions.  Megan Mans, RN 10/25/2018, 10:20 AM

## 2018-10-25 NOTE — Progress Notes (Addendum)
Adult Psychoeducational Group Note  Date:  10/25/2018 Time:  9:26 PM  Group Topic/Focus:  Wrap-Up Group:   The focus of this group is to help patients review their daily goal of treatment and discuss progress on daily workbooks.  Participation Level:  Active  Participation Quality:  Appropriate  Affect:  Appropriate  Cognitive:  Appropriate  Insight: Appropriate  Engagement in Group:  Engaged  Modes of Intervention:  Discussion  Additional Comments:  Patient attended group and said that his day was a 59. His positive word for the day is Hope. He felt hopeful because just by socializing with his peers he realized that he's not the only one having such a problem.   Apryl Brymer W Nikolos Billig 09/25/3417, 9:26 PM

## 2018-10-25 NOTE — Progress Notes (Signed)
Patient presents to [facility] on a voluntary basis due to increased depression, anxiety, pain, and alcohol consumption. Patient said he was sober for about five months in the beginning of the year, then he relapsed. Patient had a subdural hematoma removed last year, and ever since then he has had chronic headaches. Patient said it starts when the muscle on the right side of his head above the ear swells up and begins the migraine. Patient drinks to self medicate, and says when he is sober the headaches "let up" a little more. Pain is constantly at least 7/10.  Patient is passively SI with no plan. Denies HI AVH.  Skin search was performed and was found unremarkable. No contraband found.

## 2018-10-25 NOTE — ED Notes (Signed)
Belongings placed in locker 12

## 2018-10-25 NOTE — BH Assessment (Signed)
Tele Assessment Note   Patient Name: Eric Clark Parrella MRN: [id number] Referring Physician: Antony MaduraKelly Humes, PA Location of Patient: [facility] Location of Provider: Behavioral Health TTS Department  Eric Clark Rinella is an 33 y.o. male.  -Clinician reviewed note by Antony MaduraKelly Humes, PA.  Patient referenced suicidal ideations in triage, but states that the pain is so severe that he has thoughts of killing himself.  States that he has no history of suicide attempt and no plan on harming himself.  When asked if he would kill himself upon leaving the emergency department, he states that he would not.  Further expresses that he is tired of experiencing this tightening pain in his right temple constantly for 1 year.  Initially had some Robaxin following his craniotomy, but feels this does not help him.  Drinks heavily as he feels this is the only way to relax his muscles.  Typically drinks about 1/5 of whiskey a day or wine.  Last drink was around noon today.  Denies any homicidal ideations, illicit drug use, auditory or visual hallucinations.  Patient says that he has been having worsening depression.  He says that tonight he had some thoughts of "drinking myself into oblivion."  Patient denies a plan to kill himself.  He does have access to a rifle at home however.  Patient denies any HI or A/V hallucinations.  Patient says that he drinks a large bottle of wine or a pint & half of liquor.  Patient denies other drug use.  Patient says he lives alone and recently moved down here from [place].  Patient says he has panic attacks at least once monthly.  He says that he has been off his medication for about a week.  [facility]  Patient's eye contact is fair.  He reports <5H/D of sleep.  Patient answers are goal oriented.  Patient was at [facility] January 28-June 15, 2018.  Patient has no current outpatient care.  -Clinician discussed patient care with Nicolette BangJason Berry,FNP.  He  recommended inpatient care.  AC Fransico MichaelKim Brooks reviewing patient.  Diagnosis: F32.2 MDD single episode, severe; F10.20 ETOH use d/o severe; F41.1 Generalized Anxiety D/O; F43.10 PTSD  Past Medical History:  Past Medical History:  Diagnosis Date  . Alcoholism (HCC)   . Seizures (HCC)   . Subdural hematoma (HCC)     Past Surgical History:  Procedure Laterality Date  . subdural hematoma removed      Family History:  Family History  Problem Relation Age of Onset  . Diabetes Mother   . Hypertension Father     Social History:  reports that he has been smoking cigarettes. He has been smoking about 0.50 packs per day. He has never used smokeless tobacco. He reports current alcohol use. He reports previous drug use.  Additional Social History:  Alcohol / Drug Use Pain Medications: None Prescriptions: Gabapentin and another med he cannot remember.  Pt has been off them for a week, ran out of them. Over the Counter: None History of alcohol / drug use?: Yes Longest period of sobriety (when/how long): 5 months.  Beginning of 2020 up to about May 2020. Negative Consequences of Use: Personal relationships Withdrawal Symptoms: Patient aware of relationship between substance abuse and physical/medical complications, Weakness, Sweats, Nausea / Vomiting, Tremors, Seizures Onset of Seizures: Coming off ETOH in May of 2019 reports a seizure Date of most recent seizure: May of 2019 Substance #1 Name of Substance 1: ETOH (liquor or wine) 1 -  Age of First Use: 33 years of age 54 - Amount (size/oz): Usually a pint and a half of liquor.  If drinking wine, the larger size bottles. 1 - Frequency: Daily 1 - Duration: Since May 2020 1 - Last Use / Amount: 07/06.  Pt BAL was 322 at 19:38 on 07/06.  CIWA: CIWA-Ar BP: 121/85 Pulse Rate: 85 Nausea and Vomiting: no nausea and no vomiting Tactile Disturbances: none Tremor: no tremor Auditory Disturbances: not present Paroxysmal Sweats: no sweat  visible Visual Disturbances: not present Anxiety: no anxiety, at ease Headache, Fullness in Head: none present Agitation: normal activity Orientation and Clouding of Sensorium: oriented and can do serial additions CIWA-Ar Total: 0 COWS:    Allergies:  Allergies  Allergen Reactions  . Shellfish Allergy Anaphylaxis  . Sunflower Oil Anaphylaxis, Itching, Palpitations, Shortness Of Breath, Swelling and Hives    Home Medications: (Not in a hospital admission)   OB/GYN Status:  No LMP for male patient.  General Assessment Data Assessment unable to be completed: Yes Reason for not completing assessment: Pt BAL was 322 at 19:38 Location of Assessment: [facility] ED TTS Assessment: In system Is this a Tele or Face-to-Face Assessment?: Tele Assessment Is this an Initial Assessment or a Re-assessment for this encounter?: Initial Assessment Patient Accompanied by:: N/A Language Other than English: No Living Arrangements: Other (Comment)(Pt lives by himself.) What gender do you identify as?: Male Marital status: Single Pregnancy Status: No Living Arrangements: Alone Can pt return to current living arrangement?: Yes Admission Status: Voluntary Is patient capable of signing voluntary admission?: Yes Referral Source: Self/Family/Friend(Pt called 911.  [employer] brought patient to hospital.) [facility]     Crisis Care Plan Living Arrangements: Alone Name of Psychiatrist: None Name of Therapist: NOne  Education Status Is patient currently in school?: No Is the patient employed, unemployed or receiving disability?: Employed  Risk to self with the past 6 months Suicidal Ideation: Yes-Currently Present Has patient been a risk to self within the past 6 months prior to admission? : No Suicidal Intent: No Has patient had any suicidal intent within the past 6 months prior to admission? : No Is patient at risk for suicide?: Yes Suicidal Plan?: No Has patient had any suicidal plan within  the past 6 months prior to admission? : No Access to Means: No What has been your use of drugs/alcohol within the last 12 months?: ETOH Previous Attempts/Gestures: No How many times?: 0 Other Self Harm Risks: None Triggers for Past Attempts: None known Intentional Self Injurious Behavior: None Family Suicide History: No Recent stressful life event(s): Other (Comment)(Pt cannot name his stressors) Persecutory voices/beliefs?: Yes Depression: Yes Depression Symptoms: Despondent, Insomnia, Isolating, Loss of interest in usual pleasures, Feeling worthless/self pity Substance abuse history and/or treatment for substance abuse?: Yes Suicide prevention information given to non-admitted patients: Not applicable  Risk to Others within the past 6 months Homicidal Ideation: No Does patient have any lifetime risk of violence toward others beyond the six months prior to admission? : No Thoughts of Harm to Others: No Current Homicidal Intent: No Current Homicidal Plan: No Access to Homicidal Means: No Identified Victim: No one History of harm to others?: Yes Assessment of Violence: In distant past Violent Behavior Description: March of 2019 Does patient have access to weapons?: Yes (Comment) Criminal Charges Pending?: Yes(Rifle at home (not locked up).) Describe Pending Criminal Charges: DUI Does patient have a court date: Yes Court Date: 11/21/18 Is patient on probation?: No  Psychosis Hallucinations: None noted  Delusions: None noted  Mental Status Report Appearance/Hygiene: Disheveled, In scrubs Eye Contact: Good Motor Activity: Freedom of movement, Unremarkable Speech: Logical/coherent Level of Consciousness: Alert Mood: Depressed, Anxious, Sad Affect: Anxious, Sad Anxiety Level: Panic Attacks Panic attack frequency: Once in a month Most recent panic attack: 3 days ago Thought Processes: Coherent, Relevant Judgement: Impaired Orientation: Person, Place, Time,  Situation Obsessive Compulsive Thoughts/Behaviors: None  Cognitive Functioning Concentration: Decreased Memory: Recent Impaired, Remote Intact Is patient IDD: No Insight: Fair Impulse Control: Poor Appetite: Good Have you had any weight changes? : No Change Sleep: Decreased Total Hours of Sleep: (<5H/D) Vegetative Symptoms: Decreased grooming  ADLScreening [facility] Assessment Services) Patient's cognitive ability adequate to safely complete daily activities?: Yes Patient able to express need for assistance with ADLs?: Yes Independently performs ADLs?: Yes (appropriate for developmental age)  Prior Inpatient Therapy Prior Inpatient Therapy: Yes Prior Therapy Dates: Jan 28-Feb26, 2020 [facility]Provider[facility]): [facility] Reason for Treatment: SA  Prior Outpatient Therapy Prior Outpatient Therapy: Yes Prior Therapy Dates: end of Feb '20 Prior Therapy Facilty[facility]Provider([facility]): [facility] Reason for Treatment: SA Does patient have an ACCT team?: No Does patient have Intensive In-House Services?  : No Does patient have [facility] services? : No Does patient have P4CC services?: No  ADL Screening (condition at time of admission) Patient's cognitive ability adequate to safely complete daily activities?: Yes Is the patient deaf or have difficulty hearing?: No Does the patient have difficulty seeing, even when wearing glasses/contacts?: Yes(Pt wears glasses.) Does the patient have difficulty concentrating, remembering, or making decisions?: Yes Patient able to express need for assistance with ADLs?: Yes Does the patient have difficulty dressing or bathing?: No Independently performs ADLs?: Yes (appropriate for developmental age) Does the patient have difficulty walking or climbing stairs?: No Weakness of Legs: None Weakness of Arms/Hands: None       Abuse/Neglect Assessment (Assessment to be complete while patient is alone) Abuse/Neglect Assessment Can Be Completed:  Yes Physical Abuse: Denies Verbal Abuse: Yes, past (Comment) Sexual Abuse: Denies Exploitation of patient/patient's resources: Denies Self-Neglect: Denies     Regulatory affairs officer (For Healthcare) Does Patient Have a Medical Advance Directive?: No Would patient like information on creating a medical advance directive?: No - Patient declined          Disposition:  Disposition Initial Assessment Completed for this Encounter: Yes Patient referred to: Other (Comment)(AC reviewing patient)  This service was provided via telemedicine using a 2-way, interactive audio and video technology.  Names of all persons participating in this telemedicine service and their role in this encounter. Name: Tevan Marian Role: patient  Name: Curlene Dolphin, M.S. LCAS QP Role: clinician  Name:  Role:   Name:  Role:     Raymondo Band 10/25/2018 5:52 AM

## 2018-10-25 NOTE — BH Assessment (Signed)
[facility] Assessment Progress Note  Clinician informed Antonietta Breach, PA that patient was accepted to [facility].  Accepting physician is Dr. Parke Poisson.  Pt can come after 08:00.  Claiborne Billings confirmed that patient has gotten his COVID test.  Patient acceptance forms to be faxed to [phone] prior to arrival.

## 2018-10-26 DIAGNOSIS — F322 Major depressive disorder, single episode, severe without psychotic features: Principal | ICD-10-CM

## 2018-10-26 LAB — LIPID PANEL
Cholesterol: 174 mg/dL (ref 0–200)
HDL: 94 mg/dL (ref >40)
LDL Cholesterol: 61 mg/dL (ref 0–99)
Total CHOL/HDL Ratio: 1.9 RATIO
Triglycerides: 93 mg/dL (ref <150)
VLDL: 19 mg/dL (ref 0–40)

## 2018-10-26 LAB — TSH: TSH: 0.963 u[IU]/mL (ref 0.350–4.500)

## 2018-10-26 MED ORDER — PRENATAL MULTIVITAMIN CH
1.0000 | ORAL_TABLET | Freq: Every day | ORAL | Status: DC
  Administered 2018-10-26 – 2018-10-27 (×2): 1 via ORAL
  Filled 2018-10-26 (×4): qty 1

## 2018-10-26 MED ORDER — SERTRALINE HCL 100 MG PO TABS
100.0000 mg | ORAL_TABLET | Freq: Every day | ORAL | Status: DC
  Administered 2018-10-26 – 2018-10-27 (×2): 100 mg via ORAL
  Filled 2018-10-26 (×4): qty 1

## 2018-10-26 NOTE — H&P (Signed)
Psychiatric Admission Assessment Adult  Patient Identification: Eric Clark MRN:  [id number] Date of Evaluation:  10/26/2018 Chief Complaint:  ETOH ABUSE DISORDER SEVERE PTSD GAD Principal Diagnosis: Current depression/alcohol abuse/relapse/history of seizures Diagnosis:  Active Problems:   Severe major depression, single episode (HCC)  History of Present Illness:   Mr. Eric Clark is a 33 year old individual who relapse with regards to alcoholism for the past week, citing the reason that he simply ran out of his sertraline had a relapse with regards to his depressive symptoms.  Presenting with a blood alcohol level of 322.   States he has been treated with sertraline since the death of his wife approximately 7 years ago he is caring for his 32 year old and 32-year-old child.  He also states he has had some PTSD symptoms due to losing his partner.  He felt it was best to come in the hospital stop the cycle of drinking, monitor for withdrawal given his history of seizures, restart his sertraline.  Overall pleasant cooperative.  Alert oriented to person place time situation eye contact good mood is clearly dysphoric but coherent and goal-directed and thought, denies thoughts of harming self today can contract for safety here.  Drug screen negative for other compounds Associated Signs/Symptoms: Depression Symptoms:  insomnia, (Hypo) Manic Symptoms:  n/a Anxiety Symptoms:  Excessive Worry, Psychotic Symptoms:  n/a PTSD Symptoms: Had a traumatic exposure:  loss of wife Total Time spent with patient: 45 minutes  Past Psychiatric History: responder to setraline  Is the patient at risk to self? Yes.    Has the patient been a risk to self in the past 6 months? No.  Has the patient been a risk to self within the distant past? No.  Is the patient a risk to others? No.  Has the patient been a risk to others in the past 6 months? No.  Has the patient been a risk to others within the distant past? No.     Alcohol Screening: 1. How often do you have a drink containing alcohol?: 4 or more times a week 2. How many drinks containing alcohol do you have on a typical day when you are drinking?: 5 or 6 3. How often do you have six or more drinks on one occasion?: Daily or almost daily AUDIT-C Score: 10 4. How often during the last year have you found that you were not able to stop drinking once you had started?: Monthly 5. How often during the last year have you failed to do what was normally expected from you becasue of drinking?: Monthly 6. How often during the last year have you needed a first drink in the morning to get yourself going after a heavy drinking session?: Monthly 7. How often during the last year have you had a feeling of guilt of remorse after drinking?: Monthly 8. How often during the last year have you been unable to remember what happened the night before because you had been drinking?: Less than monthly 9. Have you or someone else been injured as a result of your drinking?: No 10. Has a relative or friend or a doctor or another health worker been concerned about your drinking or suggested you cut down?: Yes, during the last year Alcohol Use Disorder Identification Test Final Score (AUDIT): 23 Alcohol Brief Interventions/Follow-up: Alcohol Education, Continued Monitoring Substance Abuse History in the last 12 months:  Yes.   Consequences of Substance Abuse: Negative NA Previous Psychotropic Medications: Yes  Psychological Evaluations: No  Past Medical History:  Past Medical History:  Diagnosis Date  . Alcoholism (HCC)   . Seizures (HCC)   . Subdural hematoma (HCC)     Past Surgical History:  Procedure Laterality Date  . subdural hematoma removed     Family History:  Family History  Problem Relation Age of Onset  . Diabetes Mother   . Hypertension Father     Tobacco Screening: Have you used any form of tobacco in the last 30 days? (Cigarettes, Smokeless Tobacco,  Cigars, and/or Pipes): Yes Tobacco use, Select all that apply: 5 or more cigarettes per day Are you interested in Tobacco Cessation Medications?: Yes, will notify MD for an order Counseled patient on smoking cessation including recognizing danger situations, developing coping skills and basic information about quitting provided: Refused/Declined practical counseling Social History:  Social History   Substance and Sexual Activity  Alcohol Use Yes     Social History   Substance and Sexual Activity  Drug Use Not Currently    Additional Social History:                           Allergies:   Allergies  Allergen Reactions  . Shellfish Allergy Anaphylaxis  . Sunflower Oil Anaphylaxis, Itching, Palpitations, Shortness Of Breath, Swelling and Hives   Lab Results:  Results for orders placed or performed during the hospital encounter of 10/25/18 (from the past 48 hour(s))  Lipid panel     Status: None   Collection Time: 10/26/18  6:47 AM  Result Value Ref Range   Cholesterol 174 0 - 200 mg/dL   Triglycerides 93 <696<150 mg/dL   HDL 94 >29>40 mg/dL   Total CHOL/HDL Ratio 1.9 RATIO   VLDL 19 0 - 40 mg/dL   LDL Cholesterol 61 0 - 99 mg/dL    Comment:        Total Cholesterol/HDL:CHD Risk Coronary Heart Disease Risk Table                     Men   Women  1/2 Average Risk   3.4   3.3  Average Risk       5.0   4.4  2 X Average Risk   9.6   7.1  3 X Average Risk  23.4   11.0        Use the calculated Patient Ratio above and the CHD Risk Table to determine the patient's CHD Risk.        ATP III CLASSIFICATION (LDL):  <100     mg/dL   Optimal  [id number]  mg/dL   Near or Above                    Optimal  130-159  mg/dL   Borderline  [phone]  mg/dL   High  >272>190     mg/dL   Very High Performed at [facility], [address] [place], [place] [address]   TSH     Status: None   Collection Time: 10/26/18  6:47 AM  Result Value Ref Range   TSH 0.963 0.350 -  4.500 uIU/mL    Comment: Performed by a 3rd Generation assay with a functional sensitivity of <=0.01 uIU/mL. Performed at [facility], [address] [place], [place] [address]     Blood Alcohol level:  Lab Results  Component Value Date   ETH 322 [facility](HH) 10/24/2018    Metabolic Disorder Labs:  No results  found for: HGBA1C, MPG No results found for: PROLACTIN Lab Results  Component Value Date   CHOL 174 10/26/2018   TRIG 93 10/26/2018   HDL 94 10/26/2018   CHOLHDL 1.9 10/26/2018   VLDL 19 10/26/2018   LDLCALC 61 10/26/2018    Current Medications: Current Facility-Administered Medications  Medication Dose Route Frequency Provider Last Rate Last Dose  . acetaminophen (TYLENOL) tablet 650 mg  650 mg Oral Q6H PRN Nira ConnBerry, Jason A, NP      . alum & mag hydroxide-simeth (MAALOX/MYLANTA) 200-200-20 MG/5ML suspension 30 mL  30 mL Oral Q4H PRN Nira ConnBerry, Jason A, NP      . hydrOXYzine (ATARAX/VISTARIL) tablet 25 mg  25 mg Oral Q6H PRN Nira ConnBerry, Jason A, NP      . levETIRAcetam (KEPPRA) tablet 500 mg  500 mg Oral BID Nira ConnBerry, Jason A, NP   500 mg at 10/26/18 [id number]  . loperamide (IMODIUM) capsule 2-4 mg  2-4 mg Oral PRN Nira ConnBerry, Jason A, NP      . LORazepam (ATIVAN) tablet 1 mg  1 mg Oral Q6H PRN Nira ConnBerry, Jason A, NP      . LORazepam (ATIVAN) tablet 1 mg  1 mg Oral TID Nira ConnBerry, Jason A, NP   1 mg at 10/26/18 [id number]   Followed by  . [START ON 10/27/2018] LORazepam (ATIVAN) tablet 1 mg  1 mg Oral BID [name], Jason A, NP       Followed by  . [START ON 10/28/2018] LORazepam (ATIVAN) tablet 1 mg  1 mg Oral Daily Nira ConnBerry, Jason A, NP      . magnesium hydroxide (MILK OF MAGNESIA) suspension 30 mL  30 mL Oral Daily PRN Nira ConnBerry, Jason A, NP      . multivitamin with minerals tablet 1 tablet  1 tablet Oral Daily Nira ConnBerry, Jason A, NP   1 tablet at 10/26/18 [phone]  . nicotine (NICODERM CQ - dosed in mg/24 hours) patch 21 mg  21 mg Transdermal Daily Malvin JohnsFarah, Shellee Streng, MD   21 mg at 10/26/18 [id number]  . ondansetron  (ZOFRAN-ODT) disintegrating tablet 4 mg  4 mg Oral Q6H PRN Nira ConnBerry, Jason A, NP      . prenatal vitamin w/FE, FA (NATACHEW) chewable tablet 1 tablet  1 tablet Oral Q1200 Malvin JohnsFarah, Deante Blough, MD      . QUEtiapine (SEROQUEL) tablet 25 mg  25 mg Oral QHS PRN Nira ConnBerry, Jason A, NP   25 mg at 10/25/18 2144  . sertraline (ZOLOFT) tablet 100 mg  100 mg Oral Daily Malvin JohnsFarah, Katlin Ciszewski, MD      . thiamine (VITAMIN B-1) tablet 100 mg  100 mg Oral Daily Nira ConnBerry, Jason A, NP   100 mg at 10/26/18 [id number]  Musculoskeletal: Strength & Muscle Tone: within normal limits Gait & Station: normal Patient leans: N/A  Psychiatric Specialty Exam: Physical Exam  Nursing note and vitals reviewed. Constitutional: He appears well-developed and well-nourished.    Review of Systems  Constitutional: Negative.   Eyes: Negative.   Cardiovascular: Negative.   Genitourinary: Negative.   Neurological: Positive for seizures.    Blood pressure 131/72, pulse 75, temperature 98.3 F (36.8 C), temperature source Oral, resp. rate 18, height 5\' 11"  (1.803 m), weight 72 kg, SpO2 100 %.Body mass index is 22.14 kg/m.  General Appearance: Casual  Eye Contact:  Good  Speech:  nl  Volume:  Normal  Mood:  Dysphoric  Affect:  Depressed  Thought Process:  Coherent and Descriptions of Associations: Intact  Orientation:  Full (Time, Place, and Person)  Thought Content:  Tangential  Suicidal Thoughts:  No  Homicidal Thoughts:  No  Memory:  Recent;   Good  Judgement:  Fair  Insight:  Good  Psychomotor Activity:  Normal  Concentration:  Concentration: Fair  Recall:  [employer] of Knowledge:  Fair  Language:  Good  Akathisia:  NA  Handed:  Right  AIMS (if indicated):     Assets:  Communication Skills  ADL's:  Intact  Cognition:  WNL  Sleep:  Number of Hours: 6.75      PTA Medications: Medications Prior to Admission  Medication Sig Dispense Refill Last Dose  . chlordiazePOXIDE (LIBRIUM) 25 MG capsule 50mg  PO TID x 1D, then 25-50mg  PO  BID X 1D, then 25-50mg  PO QD X 1D 10 capsule 0   . famotidine (PEPCID) 20 MG tablet Take 2 tablets (40 mg total) by mouth 2 (two) times daily. (Patient not taking: Reported on 10/19/2018) 60 tablet 0   . gabapentin (NEURONTIN) 300 MG capsule Take 300 mg by mouth 3 (three) times daily.     Marland Kitchen levETIRAcetam (KEPPRA) 500 MG tablet Take 500 mg by mouth 2 (two) times daily.     . ondansetron (ZOFRAN ODT) 4 MG disintegrating tablet Take 1 tablet (4 mg total) by mouth every 4 (four) hours as needed for nausea or vomiting. (Patient not taking: Reported on 10/19/2018) 20 tablet 0    Treatment Plan Summary: Daily contact with patient to assess and evaluate symptoms and progress in treatment and Medication management  Observation Level/Precautions:  15 minute checks  Laboratory:  UDS  Psychotherapy:    Medications:    Consultations:    Discharge Concerns:    Estimated LOS:  Other:     Physician Treatment Plan for Primary Diagnosis: <principal problem not specified> Long Term Goal(s): Improvement in symptoms so as ready for discharge  Short Term Goals: Ability to disclose and discuss suicidal ideas, Ability to demonstrate self-control will improve and Ability to identify and develop effective coping behaviors will improve  Physician Treatment Plan for Secondary Diagnosis: Active Problems:   Severe major depression, single episode ([place])  Long Term Goal(s): Improvement in symptoms so as ready for discharge  Short Term Goals: Ability to demonstrate self-control will improve, Ability to identify and develop effective coping behaviors will improve and Ability to maintain clinical measurements within normal limits will improve  I certify that inpatient services furnished can reasonably be expected to improve the patient's condition.    Johnn Hai, MD 7/8/202010:24 AM

## 2018-10-26 NOTE — [facility] Suicide Risk Assessment (Signed)
[facility] Admission Suicide Risk Assessment   Nursing information obtained from:  Patient Demographic factors:  Divorced or widowed, Male, Access to firearms Current Mental Status:  Self-harm thoughts Loss Factors:  Loss of significant relationship, Decline in physical health Historical Factors:  Impulsivity Risk Reduction Factors:  Sense of responsibility to family, Positive social support  Total Time spent with patient: 45 minutes Principal Problem: MDD - ETOH relapse Diagnosis:  Active Problems:   Severe major depression, single episode (HCC)  Subjective Data: Patient states he is off his Zoloft blood alcohol 322 post 1 week binge/relapse  Continued Clinical Symptoms:  Alcohol Use Disorder Identification Test Final Score (AUDIT): 23 The "Alcohol Use Disorders Identification Test", Guidelines for Use in Primary Care, Second Edition.  World Pharmacologist [facility]). Score between 0-7:  no or low risk or alcohol related problems. Score between 8-15:  moderate risk of alcohol related problems. Score between 16-19:  high risk of alcohol related problems. Score 20 or above:  warrants further diagnostic evaluation for alcohol dependence and treatment.   CLINICAL FACTORS:   Depression:   Anhedonia   Musculoskeletal: Strength & Muscle Tone: within normal limits Gait & Station: normal Patient leans: N/A  Psychiatric Specialty Exam: Physical Exam  Nursing note and vitals reviewed. Constitutional: He appears well-developed and well-nourished.    Review of Systems  Constitutional: Negative.   Eyes: Negative.   Cardiovascular: Negative.   Genitourinary: Negative.   Neurological: Positive for seizures.    Blood pressure 131/72, pulse 75, temperature 98.3 F (36.8 C), temperature source Oral, resp. rate 18, height 5\' 11"  (1.803 m), weight 72 kg, SpO2 100 %.Body mass index is 22.14 kg/m.  General Appearance: Casual  Eye Contact:  Good  Speech:  nl  Volume:  Normal  Mood:  Dysphoric   Affect:  Depressed  Thought Process:  Coherent and Descriptions of Associations: Intact  Orientation:  Full (Time, Place, and Person)  Thought Content:  Tangential  Suicidal Thoughts:  No  Homicidal Thoughts:  No  Memory:  Recent;   Good  Judgement:  Fair  Insight:  Good  Psychomotor Activity:  Normal  Concentration:  Concentration: Fair  Recall:  [employer] of Knowledge:  Fair  Language:  Good  Akathisia:  NA  Handed:  Right  AIMS (if indicated):     Assets:  Communication Skills  ADL's:  Intact  Cognition:  WNL  Sleep:  Number of Hours: 6.75      COGNITIVE FEATURES THAT CONTRIBUTE TO RISK:  None    SUICIDE RISK:   Minimal: No identifiable suicidal ideation.  Patients presenting with no risk factors but with morbid ruminations; may be classified as minimal risk based on the severity of the depressive symptoms  PLAN OF CARE: full   I certify that inpatient services furnished can reasonably be expected to improve the patient's condition.   Johnn Hai, MD 10/26/2018, 10:21 AM

## 2018-10-26 NOTE — Tx Team (Signed)
Interdisciplinary Treatment and Diagnostic Plan Update  10/26/2018 Time of Session: 9:00am Eric Clark Eric Clark MRN: [id number]  Principal Diagnosis: <principal problem not specified>  Secondary Diagnoses: Active Problems:   Severe major depression, single episode (HCC)   Current Medications:  Current Facility-Administered Medications  Medication Dose Route Frequency Provider Last Rate Last Dose  . acetaminophen (TYLENOL) tablet 650 mg  650 mg Oral Q6H PRN Nira ConnBerry, Jason A, NP      . alum & mag hydroxide-simeth (MAALOX/MYLANTA) 200-200-20 MG/5ML suspension 30 mL  30 mL Oral Q4H PRN Nira ConnBerry, Jason A, NP      . hydrOXYzine (ATARAX/VISTARIL) tablet 25 mg  25 mg Oral Q6H PRN Nira ConnBerry, Jason A, NP      . levETIRAcetam (KEPPRA) tablet 500 mg  500 mg Oral BID Nira ConnBerry, Jason A, NP   500 mg at 10/26/18 [id number]  . loperamide (IMODIUM) capsule 2-4 mg  2-4 mg Oral PRN Nira ConnBerry, Jason A, NP      . LORazepam (ATIVAN) tablet 1 mg  1 mg Oral Q6H PRN Nira ConnBerry, Jason A, NP      . LORazepam (ATIVAN) tablet 1 mg  1 mg Oral TID Nira ConnBerry, Jason A, NP   1 mg at 10/26/18 [id number]   Followed by  . [START ON 10/27/2018] LORazepam (ATIVAN) tablet 1 mg  1 mg Oral BID [name], Jason A, NP       Followed by  . [START ON 10/28/2018] LORazepam (ATIVAN) tablet 1 mg  1 mg Oral Daily Nira ConnBerry, Jason A, NP      . magnesium hydroxide (MILK OF MAGNESIA) suspension 30 mL  30 mL Oral Daily PRN Nira ConnBerry, Jason A, NP      . multivitamin with minerals tablet 1 tablet  1 tablet Oral Daily Nira ConnBerry, Jason A, NP   1 tablet at 10/26/18 [phone]  . nicotine (NICODERM CQ - dosed in mg/24 hours) patch 21 mg  21 mg Transdermal Daily Malvin JohnsFarah, Brian, MD   21 mg at 10/26/18 [id number]  . ondansetron (ZOFRAN-ODT) disintegrating tablet 4 mg  4 mg Oral Q6H PRN Nira ConnBerry, Jason A, NP      . QUEtiapine (SEROQUEL) tablet 25 mg  25 mg Oral QHS PRN Nira ConnBerry, Jason A, NP   25 mg at 10/25/18 [id number]  . thiamine (VITAMIN B-1) tablet 100 mg  100 mg Oral Daily Nira ConnBerry, Jason A, NP   100 mg at 10/26/18 [id number]   PTA  Medications: Medications Prior to Admission  Medication Sig Dispense Refill Last Dose  . chlordiazePOXIDE (LIBRIUM) 25 MG capsule 50mg  PO TID x 1D, then 25-50mg  PO BID X 1D, then 25-50mg  PO QD X 1D 10 capsule 0   . famotidine (PEPCID) 20 MG tablet Take 2 tablets (40 mg total) by mouth 2 (two) times daily. (Patient not taking: Reported on 10/19/2018) 60 tablet 0   . gabapentin (NEURONTIN) 300 MG capsule Take 300 mg by mouth 3 (three) times daily.     Marland Kitchen. levETIRAcetam (KEPPRA) 500 MG tablet Take 500 mg by mouth 2 (two) times daily.     . ondansetron (ZOFRAN ODT) 4 MG disintegrating tablet Take 1 tablet (4 mg total) by mouth every 4 (four) hours as needed for nausea or vomiting. (Patient not taking: Reported on 10/19/2018) 20 tablet 0     Patient Stressors: Health problems Substance abuse  Patient Strengths: Ability for insight Active sense of humor Average or above average intelligence  Treatment Modalities: Medication Management, Group therapy, Case management,  1 to 1 session with clinician, Psychoeducation, Recreational therapy.  Physician Treatment Plan for Primary Diagnosis: <principal problem not specified> Long Term Goal(s):     Short Term Goals:    Medication Management: Evaluate patient's response, side effects, and tolerance of medication regimen.  Therapeutic Interventions: 1 to 1 sessions, Unit Group sessions and Medication administration.  Evaluation of Outcomes: Progressing  Physician Treatment Plan for Secondary Diagnosis: Active Problems:   Severe major depression, single episode ([place])  Long Term Goal(s):     Short Term Goals:       Medication Management: Evaluate patient's response, side effects, and tolerance of medication regimen.  Therapeutic Interventions: 1 to 1 sessions, Unit Group sessions and Medication administration.  Evaluation of Outcomes: Progressing   RN Treatment Plan for Primary Diagnosis: <principal problem not specified> Long Term Goal(s):  Knowledge of disease and therapeutic regimen to maintain health will improve  Short Term Goals: Ability to identify and develop effective coping behaviors will improve and Compliance with prescribed medications will improve  Medication Management: RN will administer medications as ordered by provider, will assess and evaluate patient's response and provide education to patient for prescribed medication. RN will report any adverse and/or side effects to prescribing provider.  Therapeutic Interventions: 1 on 1 counseling sessions, Psychoeducation, Medication administration, Evaluate responses to treatment, Monitor vital signs and CBGs as ordered, Perform/monitor CIWA, COWS, AIMS and Fall Risk screenings as ordered, Perform wound care treatments as ordered.  Evaluation of Outcomes: Progressing   LCSW Treatment Plan for Primary Diagnosis: <principal problem not specified> Long Term Goal(s): Safe transition to appropriate next level of care at discharge, Engage patient in therapeutic group addressing interpersonal concerns.  Short Term Goals: Engage patient in aftercare planning with referrals and resources, Increase social support, Identify triggers associated with mental health/substance abuse issues and Increase skills for wellness and recovery  Therapeutic Interventions: Assess for all discharge needs, 1 to 1 time with Social worker, Explore available resources and support systems, Assess for adequacy in community support network, Educate family and significant other(s) on suicide prevention, Complete Psychosocial Assessment, Interpersonal group therapy.  Evaluation of Outcomes: Progressing   Progress in Treatment: Attending groups: Yes. Participating in groups: Yes. Taking medication as prescribed: Yes. Toleration medication: Yes. Family/Significant other contact made: No, will contact:  supports if consents are gratned Patient understands diagnosis: Yes. Discussing patient identified  problems/goals with staff: Yes. Medical problems stabilized or resolved: No. Denies suicidal/homicidal ideation: Yes. Issues/concerns per patient self-inventory: Yes.  New problem(s) identified: Yes, Describe:  chronic pain/headaches  New Short Term/Long Term Goal(s): detox, medication management for mood stabilization; elimination of SI thoughts; development of comprehensive mental wellness/sobriety plan.  Patient Goals:    Discharge Plan or Barriers: CSW continuing to assess for appropriate referrals.  Reason for Continuation of Hospitalization: Anxiety Depression Medical Issues  Estimated Length of Stay: 3-5 days  Attendees: Patient: 10/26/2018 10:07 AM  Physician:  10/26/2018 10:07 AM  Nursing:  10/26/2018 10:07 AM  RN Care Manager: 10/26/2018 10:07 AM  Social Worker: Stephanie Acre, [place] 10/26/2018 10:07 AM  Recreational Therapist:  10/26/2018 10:07 AM  Other:  10/26/2018 10:07 AM  Other:  10/26/2018 10:07 AM  Other: 10/26/2018 10:07 AM    Scribe for Treatment Team: Joellen Jersey, [place] 10/26/2018 10:07 AM

## 2018-10-26 NOTE — Plan of Care (Signed)
Nursing Progress Note [phone]  Patient presents with animated affect and is pleasant during interaction. Patient compliant with scheduled medications and does not request PRNs. Patient currently denies SI/HI/AVH or withdrawal at this time.  Patient is educated about and provided medication per provider's orders. Patient safety maintained with q15 min safety checks and high fall risk precautions. Emotional support given, 1:1 interaction, and active listening provided. Patient encouraged to attend meals, groups, and work on treatment plan and goals. Labs, vital signs and patient behavior monitored throughout shift. Patient encouraged to wear mask when in the milieu and is educated about coronavirus infection control precautions.  Patient refusing to wear mask on the unit but is compliant to wear mask off unit. Patient contracts for safety with staff. Patient remains safe on the unit at this time and agrees to come to staff with any issues/concerns. Patient is interacting with peers appropriately on the unit. Will continue to support and monitor.   Problem: Health Behavior/Discharge Planning: Goal: Compliance with treatment plan for underlying cause of condition will improve Outcome: Progressing   Problem: Physical Regulation: Goal: Ability to maintain clinical measurements within normal limits will improve Outcome: Progressing   Problem: Safety: Goal: Periods of time without injury will increase Outcome: Progressing    [place] NOVEL CORONAVIRUS (COVID-19) DAILY CHECK-OFF SYMPTOMS - answer yes or no to each - every day NO YES  Have you had a fever in the past 24 hours?  Fever (Temp > 37.80C / 100F) X   Have you had any of these symptoms in the past 24 hours? New Cough  Sore Throat   Shortness of Breath  Difficulty Breathing  Unexplained Body Aches   X   Have you had any one of these symptoms in the past 24 hours not related to allergies?   Runny Nose  Nasal Congestion  Sneezing    X   If you have had runny nose, nasal congestion, sneezing in the past 24 hours, has it worsened?  X   EXPOSURES - check yes or no X   Have you traveled outside the state in the past 14 days?  X   Have you been in contact with someone with a confirmed diagnosis of COVID-19 or PUI in the past 14 days without wearing appropriate PPE?  X   Have you been living in the same home as a person with confirmed diagnosis of COVID-19 or a PUI (household contact)?    X   Have you been diagnosed with COVID-19?    X              What to do next: Answered NO to all: Answered YES to anything:   Proceed with unit schedule Follow the [facility] Inpatient Flowsheet.

## 2018-10-26 NOTE — [facility] Suicide Risk Assessment (Signed)
[place] INPATIENT:  Family/Significant Other Suicide Prevention Education  Suicide Prevention Education:  Patient Refusal for Family/Significant Other Suicide Prevention Education: The patient Eric Clark has refused to provide written consent for family/significant other to be provided Family/Significant Other Suicide Prevention Education during admission and/or prior to discharge.  Physician notified.   Of note, patient lives alone and has access to firearms. SPE reviewed with patient.  Joellen Jersey 10/26/2018, 4:16 PM
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
## 2018-10-27 LAB — HEMOGLOBIN A1C
Hgb A1c MFr Bld: 5 % (ref 4.8–5.6)
Mean Plasma Glucose: 97 mg/dL

## 2018-10-27 MED ORDER — ACAMPROSATE CALCIUM 333 MG PO TBEC: 666.0000 mg | DELAYED_RELEASE_TABLET | Freq: Three times a day (TID) | ORAL | 5 refills | Status: DC

## 2018-10-27 MED ORDER — FLUOXETINE HCL 20 MG PO CAPS: 20.0000 mg | ORAL_CAPSULE | Freq: Every day | ORAL | 2 refills | Status: DC

## 2018-10-27 NOTE — Discharge Summary (Signed)
Physician Discharge Summary Note  Patient:  Eric Clark is an 33 y.o., male MRN:  [id number] DOB:  1985/12/09 Patient phone:  [phone] (home)  Patient address:   [address] [place] [id number],  Total Time spent with patient: 45 minutes  Date of Admission:  10/25/2018 Date of Discharge: 10/27/2018  Reason for Admission:   Mr. Eric Clark is a 33 year old individual who relapse with regards to alcoholism for the past week, citing the reason that he simply ran out of his sertraline had a relapse with regards to his depressive symptoms.  Presenting with a blood alcohol level of 322.   States he has been treated with sertraline since the death of his wife approximately 7 years ago he is caring for his 32 year old and 32-year-old child.  He also states he has had some PTSD symptoms due to losing his partner.  He felt it was best to come in the hospital stop the cycle of drinking, monitor for withdrawal given his history of seizures, restart his sertraline.  Overall pleasant cooperative.  Alert oriented to person place time situation eye contact good mood is clearly dysphoric but coherent and goal-directed and thought, denies thoughts of harming self today can contract for safety here.  Drug screen negative for other compounds  Principal Problem: Relapse discussed  discharge Diagnoses: Active Problems:   Severe major depression, single episode [facility](HCC)   Past Medical History:  Past Medical History:  Diagnosis Date  . Alcoholism (HCC)   . Seizures (HCC)   . Subdural hematoma (HCC)     Past Surgical History:  Procedure Laterality Date  . subdural hematoma removed     Family History:  Family History  Problem Relation Age of Onset  . Diabetes Mother   . Hypertension Father    Family Psychiatric  History: neg Social History:  Social History   Substance and Sexual Activity  Alcohol Use Yes     Social History   Substance and Sexual Activity  Drug Use Not Currently     Social History   Socioeconomic History  . Marital status: Single    Spouse name: Not on file  . Number of children: Not on file  . Years of education: Not on file  . Highest education level: Not on file  Occupational History  . Not on file  Social Needs  . Financial resource strain: Not on file  . Food insecurity    Worry: Not on file    Inability: Not on file  . Transportation needs    Medical: Not on file    Non-medical: Not on file  Tobacco Use  . Smoking status: Current Every Day Smoker    Packs/day: 0.50    Types: Cigarettes  . Smokeless tobacco: Never Used  Substance and Sexual Activity  . Alcohol use: Yes  . Drug use: Not Currently  . Sexual activity: Not Currently  Lifestyle  . Physical activity    Days per week: Not on file    Minutes per session: Not on file  . Stress: Not on file  Relationships  . Social Musicianconnections    Talks on phone: Not on file    Gets together: Not on file    Attends religious service: Not on file    Active member of club or organization: Not on file    Attends meetings of clubs or organizations: Not on file    Relationship status: Not on file  Other Topics Concern  . Not on file  Social History Narrative  . Not on file    Hospital Course: Patient was always pleasant and cooperative and displayed no dangerous behaviors here he complied fully with meds and recommendations.  By the date of the ninth he had achieved his goal he was having no cravings no withdrawal symptoms wanted to get back to work so forth he was contracting fully and no psychosis no acute dangerousness stable for release  Physical Findings: AIMS: Facial and Oral Movements Muscles of Facial Expression: None, normal Lips and Perioral Area: None, normal Jaw: None, normal Tongue: None, normal,Extremity Movements Upper (arms, wrists, hands, fingers): None, normal Lower (legs, knees, ankles, toes): None, normal, Trunk Movements Neck, shoulders, hips: None, normal,  Overall Severity Severity of abnormal movements (highest score from questions above): None, normal Incapacitation due to abnormal movements: None, normal Patient's awareness of abnormal movements (rate only patient's report): No Awareness, Dental Status Current problems with teeth and/or dentures?: No Does patient usually wear dentures?: No  CIWA:  CIWA-Ar Total: 1 COWS:     Musculoskeletal: Strength & Muscle Tone: within normal limits Gait & Station: normal Patient leans: N/A  Psychiatric Specialty Exam: Physical Exam  ROS  Blood pressure 125/89, pulse 94, temperature 98.7 F (37.1 C), temperature source Oral, resp. rate 18, height 5\' 11"  (1.803 m), weight 72 kg, SpO2 100 %.Body mass index is 22.14 kg/m.  General Appearance: Casual  Eye Contact:  Good  Speech:  Clear and Coherent  Volume:  Normal  Mood:  Euthymic  Affect:  Appropriate and Congruent  Thought Process:  Coherent  Orientation:  Full (Time, Place, and Person)  Thought Content:  Logical  Suicidal Thoughts:  No  Homicidal Thoughts:  No  Memory:  Immediate;   Fair  Judgement:  Good  Insight:  Good  Psychomotor Activity:  Normal  Concentration:  Concentration: Good  Recall:  Good  Fund of Knowledge:  Good  Language:  Good  Akathisia:  Negative  Handed:  Right  AIMS (if indicated):     Assets:  Communication Skills Desire for Improvement  ADL's:  Intact  Cognition:  WNL  Sleep:  Number of Hours: 6.25     Have you used any form of tobacco in the last 30 days? (Cigarettes, Smokeless Tobacco, Cigars, and/or Pipes): Yes  Has this patient used any form of tobacco in the last 30 days? (Cigarettes, Smokeless Tobacco, Cigars, and/or Pipes) Yes, No  Blood Alcohol level:  Lab Results  Component Value Date   ETH 322 (HH) 10/24/2018    Metabolic Disorder Labs:  Lab Results  Component Value Date   HGBA1C 5.0 10/26/2018   MPG 97 10/26/2018   No results found for: PROLACTIN Lab Results  Component Value Date    CHOL 174 10/26/2018   TRIG 93 10/26/2018   HDL 94 10/26/2018   CHOLHDL 1.9 10/26/2018   VLDL 19 10/26/2018   LDLCALC 61 10/26/2018    See Psychiatric Specialty Exam and Suicide Risk Assessment completed by Attending Physician prior to discharge.  Discharge destination:  Home  Is patient on multiple antipsychotic therapies at discharge:  No   Has Patient had three or more failed trials of antipsychotic monotherapy by history:  No  Recommended Plan for Multiple Antipsychotic Therapies: NA   Allergies as of 10/27/2018      Reactions   Shellfish Allergy Anaphylaxis   Sunflower Oil Anaphylaxis, Itching, Palpitations, Shortness Of Breath, Swelling, Hives      Medication List    STOP taking these medications  chlordiazePOXIDE 25 MG capsule Commonly known as: LIBRIUM   famotidine 20 MG tablet Commonly known as: PEPCID     TAKE these medications     Indication  acamprosate 333 MG tablet Commonly known as: CAMPRAL Take 2 tablets (666 mg total) by mouth 3 (three) times daily.  Indication: Abuse or Misuse of Alcohol   FLUoxetine 20 MG capsule Commonly known as: PROZAC Take 1 capsule (20 mg total) by mouth daily.  Indication: Depression   gabapentin 300 MG capsule Commonly known as: NEURONTIN Take 300 mg by mouth 3 (three) times daily.  Indication: Abuse or Misuse of Alcohol   Keppra 500 MG tablet Generic drug: levETIRAcetam Take 500 mg by mouth 2 (two) times daily.  Indication: Seizure   ondansetron 4 MG disintegrating tablet Commonly known as: Zofran ODT Take 1 tablet (4 mg total) by mouth every 4 (four) hours as needed for nausea or vomiting.  Indication: Nausea and Vomiting      Follow-up [place]. Go on 10/28/2018.   Specialty: Behavioral Health Why: Your intake appointment for medication management and therapy is scheduled for Friday, 10/28/2018 at 2:00pm. Please bring photo ID, proof of insurance, and your hospital discharge  paperwork with you. Contact information: [address] [place] [address] [phone]           Signed: Johnn Hai, MD 10/27/2018, 2:27 PM

## 2018-10-27 NOTE — Progress Notes (Signed)
Patient ID: Eric Clark, male   DOB: January 10, 1986, 33 y.o.   MRN: [id number]  Discharge Note  Patient denies SI/HI and states readiness for discharge or withdrawal symptoms.  Written and verbal discharge instructions reviewed with the patient. Patient accepting to information and verbalized understanding with no concerns. All belongings returned to patient from the unit and secured lockers. Patient has completed their Suicide Safety Plan and has been provided Suicide Prevention resources. Patient provided an opportunity to complete and return Patient Satisfaction Survey.   Patient was safely escorted to the lobby for discharge. Patient discharged from [facility] with prescriptions, personal belongings from unit/locker, follow-up appointment in place and discharge instructions.

## 2018-10-27 NOTE — BHH Suicide Risk Assessment (Signed)
[facility] Discharge Suicide Risk Assessment   Principal Problem: <principal problem not specified> Discharge Diagnoses: Active Problems:   Severe major depression, single episode (HCC)   Total Time spent with patient: 45 minutes Musculoskeletal: Strength & Muscle Tone: within normal limits Gait & Station: normal Patient leans: N/A  Psychiatric Specialty Exam: Physical Exam  ROS  Blood pressure 125/89, pulse 94, temperature 98.7 F (37.1 C), temperature source Oral, resp. rate 18, height 5\' 11"  (1.803 m), weight 72 kg, SpO2 100 %.Body mass index is 22.14 kg/m.  General Appearance: Casual  Eye Contact:  Good  Speech:  Clear and Coherent  Volume:  Normal  Mood:  Euthymic  Affect:  Appropriate and Congruent  Thought Process:  Coherent  Orientation:  Full (Time, Place, and Person)  Thought Content:  Logical  Suicidal Thoughts:  No  Homicidal Thoughts:  No  Memory:  Immediate;   Fair  Judgement:  Good  Insight:  Good  Psychomotor Activity:  Normal  Concentration:  Concentration: Good  Recall:  Good  Fund of Knowledge:  Good  Language:  Good  Akathisia:  Negative  Handed:  Right  AIMS (if indicated):     Assets:  Communication Skills Desire for Improvement  ADL's:  Intact  Cognition:  WNL  Sleep:  Number of Hours: 6.25    Mental Status Per Nursing Assessment::   On Admission:  Self-harm thoughts  Demographic Factors:  Male and Living alone  Loss Factors: Loss of significant relationship  Historical Factors: NA  Risk Reduction Factors:   Religious beliefs about death  Continued Clinical Symptoms:  Alcohol/Substance Abuse/Dependencies  Cognitive Features That Contribute To Risk:  None    Suicide Risk:  Minimal: No identifiable suicidal ideation.  Patients presenting with no risk factors but with morbid ruminations; may be classified as minimal risk based on the severity of the depressive symptoms  [place]. Go on  10/28/2018.   Specialty: Behavioral Health Why: Your intake appointment for medication management and therapy is scheduled for Friday, 10/28/2018 at 2:00pm. Please bring photo ID, proof of insurance, and your hospital discharge paperwork with you. Contact information: [address] [place] [address] [phone]           Plan Of Care/Follow-up recommendations:  Activity:  full  Eric Windom, MD 10/27/2018, 2:29 PM

## 2018-10-27 NOTE — Progress Notes (Signed)
  [facility] Adult Case Management Discharge Plan :  Will you be returning to the same living situation after discharge:  Yes,  home At discharge, do you have transportation home?: Yes,  reports his apartment is close by and he would prefer to walk. CSW asked patient to follow up if he would like transportation assistance. 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  Patient to Follow up at: [place]. Go on 10/28/2018.   Specialty: Behavioral Health Why: Your intake appointment for medication management and therapy is scheduled for Friday, 10/28/2018 at 2:00pm. Please bring photo ID, proof of insurance, and your hospital discharge paperwork with you. Contact information: [place] [address] [phone]           Next level of care provider has access to [place] and Suicide Prevention discussed: Yes,  with patient.  Have you used any form of tobacco in the last 30 days? (Cigarettes, Smokeless Tobacco, Cigars, and/or Pipes): Yes  Has patient been referred to the Quitline?: Patient refused referral  Patient has been referred for addiction treatment: Yes  Joellen Jersey, [place] 10/27/2018, 9:05 AM

## 2018-11-14 ENCOUNTER — Emergency Department (HOSPITAL_COMMUNITY): Payer: 59

## 2018-11-14 ENCOUNTER — Other Ambulatory Visit: Payer: Self-pay

## 2018-11-14 ENCOUNTER — Emergency Department (HOSPITAL_COMMUNITY)
Admission: EM | Admit: 2018-11-14 | Discharge: 2018-11-14 | Disposition: A | Discharge status: Home / Self Care | Payer: 59 | Attending: Emergency Medicine | Admitting: Emergency Medicine

## 2018-11-14 DIAGNOSIS — F17210 Nicotine dependence, cigarettes, uncomplicated: Secondary | ICD-10-CM | POA: Diagnosis not present

## 2018-11-14 DIAGNOSIS — R519 Headache, unspecified: Secondary | ICD-10-CM

## 2018-11-14 DIAGNOSIS — Z79899 Other long term (current) drug therapy: Secondary | ICD-10-CM | POA: Insufficient documentation

## 2018-11-14 DIAGNOSIS — R51 Headache: Secondary | ICD-10-CM | POA: Insufficient documentation

## 2018-11-14 MED ORDER — DIPHENHYDRAMINE HCL 50 MG/ML IJ SOLN
25.0000 mg | Freq: Once | INTRAMUSCULAR | Status: AC
  Administered 2018-11-14: 25 mg via INTRAVENOUS
  Filled 2018-11-14: qty 1

## 2018-11-14 MED ORDER — METOCLOPRAMIDE HCL 5 MG/ML IJ SOLN
10.0000 mg | Freq: Once | INTRAMUSCULAR | Status: AC
  Administered 2018-11-14: 10 mg via INTRAVENOUS
  Filled 2018-11-14: qty 2

## 2018-11-14 MED ORDER — SODIUM CHLORIDE 0.9 % IV BOLUS
1000.0000 mL | Freq: Once | INTRAVENOUS | Status: AC
  Administered 2018-11-14: 1000 mL via INTRAVENOUS

## 2018-11-14 NOTE — ED Notes (Signed)
Patient reports a "muscle"  Headache in location of his surgery from last year, he also states the sites his drains came out of head are more painful and fill different when he palpates the area.

## 2018-11-14 NOTE — ED Provider Notes (Signed)
[facility] EMERGENCY [facility]PARTMENT Provider Note   CSN: [id number] Arrival date & time: 11/14/18  1544    History   Chief Complaint Chief Complaint  Patient presents with  . Headache    HPI Eric Clark is a 33 y.o. male with PMH/o alcoholism, seizures, subdural hematoma who presents for evaluation of right sided headache.  States that this is been ongoing for the last week or so but feeling it worsened yesterday.  He states that it feels like a tension headache.  He describes it as "a marble pushing up on the right side of his head."  He states that he has had headaches previously since his craniotomy about a year ago.  He does feel that this 1 is different.  He states that he feels like when he rubs it, it makes it better.  He has tried putting Tiger balm on his head to help with it.  He is not taking any other oral medications.  He has an appointment scheduled with the neurologist on 11/22/2018.  Patient states he has not noted any fevers, numbness/weakness of his arms or legs, vision changes, chest pain, difficulty breathing, nausea/vomiting.     The history is provided by the patient.    Past Medical History:  Diagnosis Date  . Alcoholism (HCC)   . Seizures (HCC)   . Subdural hematoma [facility](HCC)     Patient Active Problem List   Diagnosis Date Noted  . Severe major depression, single episode (HCC) 10/25/2018    Past Surgical History:  Procedure Laterality Date  . subdural hematoma removed          Home Medications    Prior to Admission medications   Medication Sig Start Date End Date Taking? Authorizing Provider  FLUoxetine (PROZAC) 20 MG capsule Take 1 capsule (20 mg total) by mouth daily. 10/27/18 10/27/19 Yes Malvin JohnsFarah, Brian, MD  gabapentin (NEURONTIN) 300 MG capsule Take 300 mg by mouth 3 (three) times daily. 09/14/18  Yes [provider]    Family History Family History  Problem Relation Age of Onset  . Diabetes Mother   . Hypertension  Father     Social History Social History   Tobacco Use  . Smoking status: Current Every Day Smoker    Packs/day: 0.50    Types: Cigarettes  . Smokeless tobacco: Never Used  Substance Use Topics  . Alcohol use: Yes  . Drug use: Not Currently     Allergies   Shellfish allergy and Sunflower oil   Review of Systems Review of Systems  Constitutional: Negative for fever.  Respiratory: Negative for cough and shortness of breath.   Cardiovascular: Negative for chest pain.  Gastrointestinal: Negative for abdominal pain, nausea and vomiting.  Genitourinary: Negative for dysuria and hematuria.  Neurological: Positive for headaches. Negative for weakness and numbness.  All other systems reviewed and are negative.    Physical Exam Updated Vital Signs BP 124/79 (BP Location: Right Arm)   Pulse (!) 108   Temp 98 F (36.7 C) (Oral)   Resp 18   Ht 5\' 9"  (1.753 m)   Wt 74.8 kg   SpO2 98% Comment: Simultaneous filing. User may not have seen previous data.  BMI 24.37 kg/m   Physical Exam Vitals signs and nursing note reviewed.  Constitutional:      Appearance: Normal appearance. He is well-developed.  HENT:     Head: Normocephalic and atraumatic.     Comments: No tenderness palpation of temporal arteries.  Craniotomy  scar noted.  No skull crepitus. Eyes:     General: Lids are normal.     Conjunctiva/sclera: Conjunctivae normal.     Pupils: Pupils are equal, round, and reactive to light.     Comments: PERRL. EOMs intact.   Neck:     Musculoskeletal: Full passive range of motion without pain.  Cardiovascular:     Rate and Rhythm: Normal rate and regular rhythm.     Pulses: Normal pulses.     Heart sounds: Normal heart sounds. No murmur. No friction rub. No gallop.   Pulmonary:     Effort: Pulmonary effort is normal.     Breath sounds: Normal breath sounds.  Abdominal:     Palpations: Abdomen is soft. Abdomen is not rigid.     Tenderness: There is no abdominal  tenderness. There is no guarding.  Musculoskeletal: Normal range of motion.  Skin:    General: Skin is warm and dry.     Capillary Refill: Capillary refill takes less than 2 seconds.  Neurological:     Mental Status: He is alert and oriented to person, place, and time.     Comments: Cranial nerves III-XII intact Follows commands, Moves all extremities  5/5 strength to BUE and BLE  Sensation intact throughout all major nerve distributions Normal coordination No gait abnormalities  No slurred speech. No facial droop.   Psychiatric:        Speech: Speech normal.      ED Treatments / Results  Labs (all labs ordered are listed, but only abnormal results are displayed) Labs Reviewed - No data to display  EKG None  Radiology Ct Head Wo Contrast  Result Date: 11/14/2018 CLINICAL DATA:  Headache right temporal area. Craniotomy for subdural hematoma last year EXAM: CT HEAD WITHOUT CONTRAST TECHNIQUE: Contiguous axial images were obtained from the base of the skull through the vertex without intravenous contrast. COMPARISON:  CT head 10/19/2018 FINDINGS: Brain: No evidence of acute infarction, hemorrhage, hydrocephalus, extra-axial collection or mass lesion/mass effect. Vascular: Negative for hyperdense vessel Skull: Right pterional craniotomy. Craniotomy flap in good position. No acute bony abnormality. Sinuses/Orbits: Negative Other: None IMPRESSION: No acute intracranial abnormality Prior right sided craniotomy for subdural drainage. No recurrent subdural hematoma. Electronically Signed   By: Marlan Palauharles  Clark M.D.   On: 11/14/2018 20:58    Procedures Procedures (including critical care time)  Medications Ordered in ED Medications  metoCLOPramide (REGLAN) injection 10 mg (10 mg Intravenous Given 11/14/18 1837)  diphenhydrAMINE (BENADRYL) injection 25 mg (25 mg Intravenous Given 11/14/18 1837)  sodium chloride 0.9 % bolus 1,000 mL (1,000 mLs Intravenous New Bag/Given 11/14/18 1838)      Initial Impression / Assessment and Plan / ED Course  I have reviewed the triage vital signs and the nursing notes.  Pertinent labs & imaging results that were available during my care of the patient were reviewed by me and considered in my medical decision making (see chart for details).        33 year old male past medical history of subdural hematoma with right-sided craniotomy who presents for evaluation of right-sided headache.  He reports that this is been going on for about a week but worsened over the last 24 hours.  No thunderclap headache.  No trauma, injury.  History of headaches but states that this 1 feels slightly different.  He describes it as "a marble pushing on the outside of his head."  No associated fevers, nausea/vomiting, numbness/weakness.  He has an appointment with neurologist next  week. Patient is afebrile, non-toxic appearing, sitting comfortably on examination table. Vital signs reviewed and stable.  No neuro deficits on exam.  He has no tenderness palpation of the temporal arteries that would be concerning for temporal arteritis.  No overlying rash that would be concerning for shingles.  Doubt CVA, intracranial hemorrhage.  History/physical exam not concerning for direct venous thrombosis, meningitis.  Given that this feels slightly different than his normal headaches, will plan for imaging.  Will give migraine cocktail and reassess.  CT head negative for any acute abnormality.  Reevaluation after migraine cocktail.  Patient reports improvement in headache.  He is sitting up and eating in the department any difficulty.  Vitals are signs are stable.  Patient comfortable going home.  Instructed patient to follow-up with neurology as previously scheduled. At this time, patient exhibits no emergent life-threatening condition that require further evaluation in ED or admission. Patient had ample opportunity for questions and discussion. All patient's questions were answered with  full understanding. Strict return precautions discussed. Patient expresses understanding and agreement to plan.   Portions of this note were generated with Lobbyist. Dictation errors may occur despite best attempts at proofreading.   Final Clinical Impressions(s) / ED Diagnoses   Final diagnoses:  Generalized headache    ED Discharge Orders    None       Desma Mcgregor 11/14/18 2116    Quintella Reichert, MD 11/21/18 1445

## 2018-11-14 NOTE — ED Triage Notes (Signed)
Pt here for evaluation of headache. Crainiotomy last year and ever couple months since then will get a tension headache. Pt sts this is one of the worst ones he's had.

## 2018-11-14 NOTE — Discharge Instructions (Signed)
You can take Tylenol or Ibuprofen as directed for pain. You can alternate Tylenol and Ibuprofen every 4 hours. If you take Tylenol at 1pm, then you can take Ibuprofen at 5pm. Then you can take Tylenol again at 9pm.   Follow-up with neurology as previously directed.  Return the emergency department for any worsening headache, numbness/weakness of your arms or legs, fever, vision changes or any other worsening or concerning symptoms.

## 2018-11-14 NOTE — ED Notes (Signed)
Pt states headache improved.  Requesting food.

## 2018-11-21 ENCOUNTER — Ambulatory Visit: Payer: 59 | Admitting: Neurology

## 2018-11-21 ENCOUNTER — Encounter: Payer: Self-pay | Admitting: *Deleted

## 2018-11-21 ENCOUNTER — Telehealth: Payer: Self-pay | Admitting: *Deleted

## 2018-11-21 NOTE — Telephone Encounter (Signed)
Pt no showed new pt appt today. 

## 2018-11-22 ENCOUNTER — Encounter: Payer: Self-pay | Admitting: Neurology

## 2018-11-24 ENCOUNTER — Other Ambulatory Visit: Payer: Self-pay

## 2018-11-24 ENCOUNTER — Emergency Department (HOSPITAL_COMMUNITY): Payer: 59

## 2018-11-24 ENCOUNTER — Emergency Department (HOSPITAL_COMMUNITY)
Admission: EM | Admit: 2018-11-24 | Discharge: 2018-11-24 | Disposition: A | Discharge status: Home / Self Care | Payer: 59 | Attending: Emergency Medicine | Admitting: Emergency Medicine

## 2018-11-24 ENCOUNTER — Encounter (HOSPITAL_COMMUNITY): Payer: Self-pay | Admitting: Emergency Medicine

## 2018-11-24 DIAGNOSIS — Z5321 Procedure and treatment not carried out due to patient leaving prior to being seen by health care provider: Secondary | ICD-10-CM | POA: Diagnosis not present

## 2018-11-24 DIAGNOSIS — R42 Dizziness and giddiness: Secondary | ICD-10-CM | POA: Insufficient documentation

## 2018-11-24 NOTE — ED Notes (Addendum)
Registration states patient turned in armband and is leaving.

## 2018-11-24 NOTE — ED Notes (Addendum)
Patient reports hx subdural hematoma and craniotomy x1 year ago. Reports swelling to right head causing dizziness and neuropathy x3 days. Sent by neurology for further evaluation. States ran out of gabapentin x3 days ago. Denies headache.

## 2018-11-24 NOTE — ED Notes (Signed)
Pt called for recheck vitals, no response from the lobby or outside of the the waiting area

## 2018-11-28 ENCOUNTER — Telehealth: Payer: Self-pay | Admitting: *Deleted

## 2018-11-28 NOTE — Telephone Encounter (Signed)
I called pt 3 times unable to reach pt, pt voicemail is full. Dr Jaynee Eagles is not filling the pt form out, pt need to take his [facility]ed group form to his primary physician. We will refund the $50.

## 2018-12-01 ENCOUNTER — Telehealth: Payer: Self-pay | Admitting: Neurology

## 2018-12-01 NOTE — Telephone Encounter (Signed)
Patient came today for $50.00 refund. Is there any way we can get this processed back to his account and get him a refund?

## 2018-12-27 ENCOUNTER — Telehealth: Payer: Self-pay | Admitting: *Deleted

## 2018-12-27 NOTE — Telephone Encounter (Signed)
Called pt to r/s his 01/02/2019 appt d/t provider out of office that day. No answer and VM box not setup.

## 2018-12-28 NOTE — Telephone Encounter (Signed)
I called the Eric Clark. No answer. I was able to LVM advising Dr. Jaynee Eagles will be out of the office on Mon 01/02/2019 and his appointment will need to be rescheduled. Left office hours in message and asked for a call back as soon as possible to reschedule.

## 2018-12-29 NOTE — Telephone Encounter (Signed)
I spoke with the pt about the appointment. He would like to see any physician as soon as possible. I advised a message would be sent to the referrals team to help him find the soonest availability. His questions were answered.

## 2019-01-02 ENCOUNTER — Other Ambulatory Visit: Payer: Self-pay

## 2019-01-02 ENCOUNTER — Ambulatory Visit: Payer: Self-pay | Admitting: Diagnostic Neuroimaging

## 2019-01-02 ENCOUNTER — Encounter: Payer: Self-pay | Admitting: Diagnostic Neuroimaging

## 2019-01-02 ENCOUNTER — Ambulatory Visit: Payer: 59 | Admitting: Neurology

## 2019-01-02 VITALS — BP 138/75 | HR 93 | Temp 96.9°F | Ht 69.0 in | Wt 172.6 lb

## 2019-01-02 DIAGNOSIS — F1011 Alcohol abuse, in remission: Secondary | ICD-10-CM

## 2019-01-02 DIAGNOSIS — S065XAA Traumatic subdural hemorrhage with loss of consciousness status unknown, initial encounter: Secondary | ICD-10-CM

## 2019-01-02 DIAGNOSIS — R569 Unspecified convulsions: Secondary | ICD-10-CM

## 2019-01-02 DIAGNOSIS — S065X9A Traumatic subdural hemorrhage with loss of consciousness of unspecified duration, initial encounter: Secondary | ICD-10-CM

## 2019-01-02 MED ORDER — GABAPENTIN 300 MG PO CAPS: 300.0000 mg | ORAL_CAPSULE | Freq: Two times a day (BID) | ORAL | 12 refills | Status: DC

## 2019-01-02 NOTE — Progress Notes (Signed)
[facility]  PATIENT: Eric Clark DOB: March 28, 1986  REFERRING CLINICIAN: ER  HISTORY FROM: patient  REASON FOR VISIT: new consult    HISTORICAL  CHIEF COMPLAINT:  Chief Complaint  Patient presents with  . Seizures    rm 6 New Pt    HISTORY OF PRESENT ILLNESS:   33 year old male here for evaluation of seizure.  May 2019 patient felt some swelling in his forehead, was having sinus infection problems, using a Nettie pot.  Swelling subsided on its own.  June 2019 patient had unprovoked seizure at the doctor's office.  He was taken to [facility] in [place] for evaluation.  He was diagnosed with new onset seizure.  No specific cause was found.  Some of the medical records indicate this could have been a complication of alcohol intoxication or alcohol withdrawal.  Patient remembers that he had been up all night with his children who are sick the night before, and then drank alcohol to try to go to sleep.  July 2019 patient had sudden onset of headache, right facial twitching, went to the hospital was diagnosed with subdural hematoma.  He remembers mention of 8 mm of midline shift.  He was taken to [facility] for craniotomy and surgical treatment.  He was started on levetiracetam for seizure prophylaxis.  He was started on gabapentin to help with nerve related pain.  By January 2020 he was taken off of levetiracetam seizure prophylaxis.  He continued on gabapentin for little while.  At some point he stopped taking this medication as well because he ran out of refills.  Also in 2020, patient had increasing stress, depression, relapsed into alcohol abuse in March and April 2020.  He reports increasing stress related to virus pandemic and other factors.  He was hospitalized for alcohol detox and suicidal ideations in July 2020.  Patient continues to have intermittent right-sided discomfort, numbness sensation, lasting minutes or hours at a time.   REVIEW OF  SYSTEMS: Full 14 system review of systems performed and negative with exception of: As per HPI.   ALLERGIES: Allergies  Allergen Reactions  . Shellfish Allergy Anaphylaxis  . Sunflower Oil Anaphylaxis, Itching, Palpitations, Shortness Of Breath, Swelling and Hives    HOME MEDICATIONS: Outpatient Medications Prior to Visit  Medication Sig Dispense Refill  . FLUoxetine (PROZAC) 20 MG capsule Take 1 capsule (20 mg total) by mouth daily. (Patient not taking: Reported on 01/02/2019) 90 capsule 2  . gabapentin (NEURONTIN) 300 MG capsule Take 300 mg by mouth 3 (three) times daily.     No facility-administered medications prior to visit.     PAST MEDICAL HISTORY: Past Medical History:  Diagnosis Date  . Alcoholism (HCC)   . 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 Seizures (HCC)    last seizure 09/2017  . Subdural hematoma (HCC) 09/2017    PAST SURGICAL HISTORY: Past Surgical History:  Procedure Laterality Date  . subdural hematoma removed  2019    FAMILY HISTORY: Family History  Problem Relation Age of Onset  . Diabetes Mother   . Hypertension Father     SOCIAL HISTORY: Social History   Socioeconomic History  . Marital status: Single    Spouse name: Not on file  . Number of children: 1  . Years of education: 84  . Highest education level: Not on file  Occupational History    Comment: [employer]  Social Needs  . Financial resource strain: Not on file  .  Food insecurity    Worry: Not on file    Inability: Not on file  . Transportation needs    Medical: Not on file    Non-medical: Not on file  Tobacco Use  . Smoking status: Current Every Day Smoker    Packs/day: 0.50    Types: Cigarettes  . Smokeless tobacco: Never Used  Substance and Sexual Activity  . Alcohol use: Yes    Comment: 01/02/19 2 24oz beers/wk, liquor on weekend  . Drug use: Not Currently  . Sexual activity: Not Currently  Lifestyle  .  Physical activity    Days per week: Not on file    Minutes per session: Not on file  . Stress: Not on file  Relationships  . Social Herbalist on phone: Not on file    Gets together: Not on file    Attends religious service: Not on file    Active member of club or organization: Not on file    Attends meetings of clubs or organizations: Not on file    Relationship status: Not on file  . Intimate partner violence    Fear of current or ex partner: Not on file    Emotionally abused: Not on file    Physically abused: Not on file    Forced sexual activity: Not on file  Other Topics Concern  . Not on file  Social History Narrative   Lives alone   Caffeine- coffee, 2 cups, some caffeine later     PHYSICAL EXAM  GENERAL EXAM/CONSTITUTIONAL: Vitals:  Vitals:   01/02/19 1429  BP: 138/75  Pulse: 93  Temp: (!) 96.9 F (36.1 C)  Weight: 172 lb 9.6 oz (78.3 kg)  Height: 5\' 9"  (1.753 m)     Body mass index is 25.49 kg/m. Wt Readings from Last 3 Encounters:  01/02/19 172 lb 9.6 oz (78.3 kg)  11/14/18 165 lb (74.8 kg)  10/25/18 158 lb 11.7 oz (72 kg)     Patient is in no distress; well developed, nourished and groomed; neck is supple  RIGHT CRANIOTOMY CHANGES  CARDIOVASCULAR:  Examination of carotid arteries is normal; no carotid bruits  Regular rate and rhythm, no murmurs  Examination of peripheral vascular system by observation and palpation is normal  EYES:  Ophthalmoscopic exam of optic discs and posterior segments is normal; no papilledema or hemorrhages  No exam data present  MUSCULOSKELETAL:  Gait, strength, tone, movements noted in Neurologic exam below  NEUROLOGIC: MENTAL STATUS:  No flowsheet data found.  awake, alert, oriented to person, place and time  recent and remote memory intact  normal attention and concentration  language fluent, comprehension intact, naming intact  fund of knowledge appropriate  CRANIAL NERVE:   2nd -  no papilledema on fundoscopic exam  2nd, 3rd, 4th, 6th - pupils equal and reactive to light, visual fields full to confrontation, extraocular muscles intact, no nystagmus  5th - facial sensation symmetric  7th - facial strength symmetric  8th - hearing intact  9th - palate elevates symmetrically, uvula midline  11th - shoulder shrug symmetric  12th - tongue protrusion midline  MOTOR:   normal bulk and tone, full strength in the BUE, BLE  SENSORY:   normal and symmetric to light touch, temperature, vibration  COORDINATION:   finger-nose-finger, fine finger movements normal  REFLEXES:   deep tendon reflexes present and symmetric  GAIT/STATION:   narrow based gait     DIAGNOSTIC DATA (LABS, IMAGING, TESTING) - I  reviewed patient records, labs, notes, testing and imaging myself where available.  Lab Results  Component Value Date   WBC 5.5 10/24/2018   HGB 17.3 (H) 10/24/2018   HCT 49.6 10/24/2018   MCV 81.4 10/24/2018   PLT 228 10/24/2018      Component Value Date/Time   NA 143 10/24/2018 1938   K 4.0 10/24/2018 1938   CL 105 10/24/2018 1938   CO2 24 10/24/2018 1938   GLUCOSE 97 10/24/2018 1938   BUN <5 (L) 10/24/2018 1938   CREATININE 0.94 10/24/2018 1938   CALCIUM 9.2 10/24/2018 1938   PROT 7.6 10/24/2018 1938   ALBUMIN 4.7 10/24/2018 1938   AST 26 10/24/2018 1938   ALT 20 10/24/2018 1938   ALKPHOS 77 10/24/2018 1938   BILITOT 0.7 10/24/2018 1938   GFRNONAA >60 10/24/2018 1938   GFRAA >60 10/24/2018 1938   Lab Results  Component Value Date   CHOL 174 10/26/2018   HDL 94 10/26/2018   LDLCALC 61 10/26/2018   TRIG 93 10/26/2018   CHOLHDL 1.9 10/26/2018   Lab Results  Component Value Date   HGBA1C 5.0 10/26/2018   No results found for: VITAMINB12 Lab Results  Component Value Date   TSH 0.963 10/26/2018    11/24/18 CT HEAD [I reviewed images myself and agree with interpretation. -VRP]  - Postoperative change on the right for previous  subdural hematoma drainage. Currently no mass, hemorrhage, or extra-axial fluid collection. Brain parenchyma appears unremarkable.    ASSESSMENT AND PLAN  33 y.o. year old male here with history of alcohol abuse, unprovoked seizure, right brain subdural hematoma.  Dx:  1. Subdural hematoma (HCC)   2. New onset seizure (HCC)   3. History of alcohol abuse     PLAN:  HISTORY OF RIGHT BRAIN SUBDURAL HEMATOMA, ALCOHOL ABUSE, single seizure July 2019 - restart gabapentin 300mg  twice a day (to help with seizure prevention and decrease nerve related pain) - seizure precautions reviewed - alcohol precautions reviewed  Meds ordered this encounter  Medications  . gabapentin (NEURONTIN) 300 MG capsule    Sig: Take 1 capsule (300 mg total) by mouth 2 (two) times daily.    Dispense:  60 capsule    Refill:  12   Return in about 6 months (around 07/02/2019).    Suanne MarkerVIKRAM R. , MD 01/02/2019, 3:19 PM Certified in Neurology, Neurophysiology and Neuroimaging  [facility] [address], [address] [place], [place] [address] [phone]

## 2019-01-02 NOTE — Patient Instructions (Signed)
-   restart gabapentin 300mg  twice a day (to help with seizure prevention and decrease nerve related pain)  - Please maintain precautions. Caution with activities where a loss of awareness could harm you or someone else. Caution with swimming alone, tub bathing, hot tubs, driving, operating motorized vehicles (cars, ATVs, motocycles, etc), lawnmowers, power tools or firearms. Caution with heights, such as rooftops, ladders or stairs. Caution with hot objects such as stoves, heaters, open fires. Wear a helmet when riding a bicycle, scooter, skateboard, etc. and avoid areas of traffic. Set your water heater to 120 degrees or less.

## 2019-01-29 ENCOUNTER — Other Ambulatory Visit: Payer: Self-pay

## 2019-01-29 ENCOUNTER — Emergency Department (HOSPITAL_COMMUNITY)
Admission: EM | Admit: 2019-01-29 | Discharge: 2019-01-30 | Disposition: A | Discharge status: Other Acute Inpatient Hospital | Payer: 59 | Attending: Emergency Medicine | Admitting: Emergency Medicine

## 2019-01-29 DIAGNOSIS — F29 Unspecified psychosis not due to a substance or known physiological condition: Secondary | ICD-10-CM | POA: Insufficient documentation

## 2019-01-29 DIAGNOSIS — Z008 Encounter for other general examination: Secondary | ICD-10-CM

## 2019-01-29 DIAGNOSIS — Z20828 Contact with and (suspected) exposure to other viral communicable diseases: Secondary | ICD-10-CM | POA: Insufficient documentation

## 2019-01-29 DIAGNOSIS — R569 Unspecified convulsions: Secondary | ICD-10-CM | POA: Insufficient documentation

## 2019-01-29 DIAGNOSIS — F17210 Nicotine dependence, cigarettes, uncomplicated: Secondary | ICD-10-CM | POA: Insufficient documentation

## 2019-01-29 LAB — RAPID URINE DRUG SCREEN, HOSP PERFORMED
Amphetamines: NOT DETECTED
Barbiturates: NOT DETECTED
Benzodiazepines: NOT DETECTED
Cocaine: NOT DETECTED
Opiates: NOT DETECTED
Tetrahydrocannabinol: NOT DETECTED

## 2019-01-29 LAB — CBC WITH DIFFERENTIAL/PLATELET
Abs Immature Granulocytes: 0.02 10*3/uL (ref 0.00–0.07)
Basophils Absolute: 0 10*3/uL (ref 0.0–0.1)
Basophils Relative: 1 %
Eosinophils Absolute: 0.2 10*3/uL (ref 0.0–0.5)
Eosinophils Relative: 3 %
HCT: 45.1 % (ref 39.0–52.0)
Hemoglobin: 15.6 g/dL (ref 13.0–17.0)
Immature Granulocytes: 0 %
Lymphocytes Relative: 34 %
Lymphs Abs: 2.5 10*3/uL (ref 0.7–4.0)
MCH: 29.2 pg (ref 26.0–34.0)
MCHC: 34.6 g/dL (ref 30.0–36.0)
MCV: 84.5 fL (ref 80.0–100.0)
Monocytes Absolute: 0.6 10*3/uL (ref 0.1–1.0)
Monocytes Relative: 8 %
Neutro Abs: 4 10*3/uL (ref 1.7–7.7)
Neutrophils Relative %: 54 %
Platelets: 193 10*3/uL (ref 150–400)
RBC: 5.34 MIL/uL (ref 4.22–5.81)
RDW: 13.8 % (ref 11.5–15.5)
WBC: 7.3 10*3/uL (ref 4.0–10.5)
nRBC: 0 % (ref 0.0–0.2)

## 2019-01-29 LAB — SALICYLATE LEVEL: Salicylate Lvl: <7 mg/dL (ref 2.8–30.0)

## 2019-01-29 LAB — ETHANOL: Alcohol, Ethyl (B): <10 mg/dL (ref <10)

## 2019-01-29 LAB — COMPREHENSIVE METABOLIC PANEL
ALT: 16 U/L (ref 0–44)
AST: 21 U/L (ref 15–41)
Albumin: 4.1 g/dL (ref 3.5–5.0)
Alkaline Phosphatase: 49 U/L (ref 38–126)
Anion gap: 11 (ref 5–15)
BUN: 10 mg/dL (ref 6–20)
CO2: 22 mmol/L (ref 22–32)
Calcium: 9.1 mg/dL (ref 8.9–10.3)
Chloride: 105 mmol/L (ref 98–111)
Creatinine, Ser: 0.94 mg/dL (ref 0.61–1.24)
GFR calc Af Amer: >60 mL/min (ref >60)
GFR calc non Af Amer: >60 mL/min (ref >60)
Glucose, Bld: 85 mg/dL (ref 70–99)
Potassium: 3.8 mmol/L (ref 3.5–5.1)
Sodium: 138 mmol/L (ref 135–145)
Total Bilirubin: 0.9 mg/dL (ref 0.3–1.2)
Total Protein: 6.7 g/dL (ref 6.5–8.1)

## 2019-01-29 LAB — ACETAMINOPHEN LEVEL: Acetaminophen (Tylenol), Serum: <10 ug/mL — ABNORMAL LOW (ref 10–30)

## 2019-01-29 MED ORDER — ACETAMINOPHEN 325 MG PO TABS: 650.0000 mg | ORAL_TABLET | ORAL | Status: DC | PRN

## 2019-01-29 MED ORDER — ALUM & MAG HYDROXIDE-SIMETH 200-200-20 MG/5ML PO SUSP: 30.0000 mL | Freq: Four times a day (QID) | ORAL | Status: DC | PRN

## 2019-01-29 MED ORDER — ONDANSETRON HCL 4 MG PO TABS: 4.0000 mg | ORAL_TABLET | Freq: Three times a day (TID) | ORAL | Status: DC | PRN

## 2019-01-29 MED ORDER — GABAPENTIN 300 MG PO CAPS
300.0000 mg | ORAL_CAPSULE | Freq: Two times a day (BID) | ORAL | Status: DC
  Administered 2019-01-29: 300 mg via ORAL
  Filled 2019-01-29: qty 1

## 2019-01-29 MED ORDER — LEVETIRACETAM 500 MG PO TABS
500.0000 mg | ORAL_TABLET | Freq: Two times a day (BID) | ORAL | Status: DC
  Administered 2019-01-29: 500 mg via ORAL
  Filled 2019-01-29: qty 1

## 2019-01-29 MED ORDER — NICOTINE 21 MG/24HR TD PT24
21.0000 mg | MEDICATED_PATCH | Freq: Every day | TRANSDERMAL | Status: DC | PRN
  Administered 2019-01-29: 22:00:00 21 mg via TRANSDERMAL
  Filled 2019-01-29: qty 1

## 2019-01-29 NOTE — BH Assessment (Addendum)
Tele Assessment Note   Patient Name: Eric Clark MRN: [id number] Referring Physician: Maia Plan, MD Location of Patient: [place] Location of Provider: Behavioral Health TTS Department  Perris Conwell is an 33 y.o. male who presents to the ED voluntarily. Pt reports he has been experiencing increased stressors over the past week and feels unstable. Pt states he feels that he is on the verge of a breakdown and states if he does not get help, he feels that he is going to hurt someone. Pt states he lost his apartment 2 days ago and it was padlocked so he is now homeless. Pt states he does not have a car and he was trying to rent a car but was unsuccessful. Pt wanted to ride his bike to his job in [place] today but because of the rain, he did not. Pt states he feels there has been one thing after another and he feels like he is having a breakdown. Pt states he thought about hurting the guy at the car rental company. Pt states he walked away and kept walking aimlessly for several hours. Pt states he "felt crazy" and knew that he needed help. Pt states he has a court date in December due to DUI.   Pt states he does not have any supports or collaterals for TTS to contact.  Nira Conn, NP recommends continued observation for safety and stabilization and to be reassessed in the AM by psych. EDP Long, Arlyss Repress, MD and Rhetta Mura A, RN have been advised.  Pt tentatively accepted to [facility] OBS 202 pending negative Covid test.  Diagnosis: MDD, single episode, w/o psychosis; Alcohol use d/o, in remission  Past Medical History:  Past Medical History:  Diagnosis Date  . Alcoholism (HCC)   . 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 Seizures (HCC)    last seizure 09/2017  . Subdural hematoma (HCC) 09/2017    Past Surgical History:  Procedure Laterality Date  . subdural hematoma removed  2019    Family History:  Family History  Problem  Relation Age of Onset  . Diabetes Mother   . Hypertension Father     Social History:  reports that he has been smoking cigarettes. He has been smoking about 0.50 packs per day. He has never used smokeless tobacco. He reports current alcohol use. He reports previous drug use.  Additional Social History:  Alcohol / Drug Use Pain Medications: See MAR Prescriptions: See MAR Over the Counter: See MAR History of alcohol / drug use?: Yes Longest period of sobriety (when/how long): 4 months Negative Consequences of Use: Personal relationships Withdrawal Symptoms: Seizures Onset of Seizures: unsure Date of most recent seizure: 2019 Substance #1 Name of Substance 1: Alcohol 1 - Age of First Use: 15 1 - Amount (size/oz): in remission 1 - Frequency: in remission 1 - Duration: years 1 - Last Use / Amount: 3 months ago  CIWA: CIWA-Ar BP: 119/87 Pulse Rate: 68 COWS:    Allergies:  Allergies  Allergen Reactions  . Shellfish Allergy Anaphylaxis  . Sunflower Oil Anaphylaxis, Itching, Palpitations, Shortness Of Breath, Swelling and Hives    Home Medications: (Not in a hospital admission)   OB/GYN Status:  No LMP for male patient.  General Assessment Data Location of Assessment: [facility] TTS Assessment: In system Is this a Tele or Face-to-Face Assessment?: Tele Assessment Is this an Initial Assessment or a Re-assessment for this encounter?: Initial Assessment Patient Accompanied by:: N/A Language Other  than English: No Living Arrangements: Homeless/Shelter What gender do you identify as?: Male Marital status: Divorced Pregnancy Status: No Living Arrangements: Alone Can pt return to current living arrangement?: Yes Admission Status: Voluntary Is patient capable of signing voluntary admission?: Yes Referral Source: Self/Family/Friend Insurance type: none     Crisis Care Plan Living Arrangements: Alone Name of Psychiatrist: none Name of Therapist: none  Education Status Is  patient currently in school?: No Is the patient employed, unemployed or receiving disability?: Employed  Risk to self with the past 6 months Suicidal Ideation: No-Not Currently/Within Last 6 Months Has patient been a risk to self within the past 6 months prior to admission? : No Suicidal Intent: No Has patient had any suicidal intent within the past 6 months prior to admission? : Yes Is patient at risk for suicide?: No Suicidal Plan?: No-Not Currently/Within Last 6 Months Has patient had any suicidal plan within the past 6 months prior to admission? : No Access to Means: No What has been your use of drugs/alcohol within the last 12 months?: alcohol Previous Attempts/Gestures: Yes How many times?: 1 Other Self Harm Risks: in July 2020 pt endorses SI but no plans Triggers for Past Attempts: Other personal contacts Intentional Self Injurious Behavior: None Family Suicide History: No Recent stressful life event(s): Legal Issues, Financial Problems, Loss (Comment)(put out of house) Persecutory voices/beliefs?: No Depression: Yes Depression Symptoms: Feeling angry/irritable, Insomnia Substance abuse history and/or treatment for substance abuse?: Yes Suicide prevention information given to non-admitted patients: Not applicable  Risk to Others within the past 6 months Homicidal Ideation: No-Not Currently/Within Last 6 Months Does patient have any lifetime risk of violence toward others beyond the six months prior to admission? : Yes (comment)(pt had thoughts of hurting others) Thoughts of Harm to Others: Yes-Currently Present Comment - Thoughts of Harm to Others: pt states he has been angry and having thoughts of hurting others Current Homicidal Intent: No Current Homicidal Plan: No Access to Homicidal Means: No History of harm to others?: No Assessment of Violence: None Noted Does patient have access to weapons?: Yes (Comment)(pt owns guns) Criminal Charges Pending?: Yes Describe  Pending Criminal Charges: DUI Does patient have a court date: Yes Court Date: (DECEMBER 2020) Is patient on probation?: No  Psychosis Hallucinations: None noted Delusions: None noted  Mental Status Report Appearance/Hygiene: Unremarkable, In scrubs Eye Contact: Good Motor Activity: Freedom of movement Speech: Logical/coherent Level of Consciousness: Alert Mood: Helpless, Depressed Affect: Flat, Depressed Anxiety Level: None Thought Processes: Relevant, Coherent Judgement: Partial Orientation: Person, Place, Time, Situation, Appropriate for developmental age Obsessive Compulsive Thoughts/Behaviors: None  Cognitive Functioning Concentration: Normal Memory: Remote Intact, Recent Intact Is patient IDD: No Insight: Fair Impulse Control: Fair Appetite: Good Have you had any weight changes? : No Change Sleep: Decreased Total Hours of Sleep: 4 Vegetative Symptoms: None  ADLScreening [facility] Assessment Services) Patient's cognitive ability adequate to safely complete daily activities?: Yes Patient able to express need for assistance with ADLs?: Yes Independently performs ADLs?: Yes (appropriate for developmental age)  Prior Inpatient Therapy Prior Inpatient Therapy: Yes Prior Therapy Dates: July 2020 Prior Therapy Facilty/Provider([facility]): [facility] Reason for Treatment: MDD  Prior Outpatient Therapy Prior Outpatient Therapy: Yes Prior Therapy Dates: 2020 Prior Therapy Facilty/Provider(s): [facility] Reason for Treatment: ALCOHOL DEPENDENCE Does patient have an ACCT team?: No Does patient have Intensive In-House Services?  : No Does patient have [facility] services? : No Does patient have P4CC services?: No  ADL Screening (condition at time of admission) Patient's cognitive  ability adequate to safely complete daily activities?: Yes Is the patient deaf or have difficulty hearing?: No Does the patient have difficulty seeing, even when wearing glasses/contacts?: No Does the  patient have difficulty concentrating, remembering, or making decisions?: No Patient able to express need for assistance with ADLs?: Yes Does the patient have difficulty dressing or bathing?: No Independently performs ADLs?: Yes (appropriate for developmental age) Does the patient have difficulty walking or climbing stairs?: No Weakness of Legs: None Weakness of Arms/Hands: None  Home Assistive Devices/Equipment Home Assistive Devices/Equipment: Eyeglasses    Abuse/Neglect Assessment (Assessment to be complete while patient is alone) Abuse/Neglect Assessment Can Be Completed: Yes Physical Abuse: Denies Verbal Abuse: Denies Sexual Abuse: Denies Exploitation of patient/patient's resources: Denies Self-Neglect: Denies     Merchant navy officerAdvance Directives (For Healthcare) Does Patient Have a Medical Advance Directive?: No Would patient like information on creating a medical advance directive?: No - Patient declined          Disposition: Nira ConnJason Berry, NP recommends continued observation for safety and stabilization and to be reassessed in the AM by psych. EDP Long, Arlyss RepressJoshua G, MD and Rhetta MuraKhalid, Ajsa A, RN have been advised.  [facility] pending negative Covid test.  Disposition Initial Assessment Completed for this Encounter: Yes Disposition of Patient: (overnight OBS pending AM psych assessment) Patient refused recommended treatment: No  This service was provided via telemedicine using a 2-way, interactive audio and video technology.  Names of all persons participating in this telemedicine service and their role in this encounter. Name: Eric Clark Role: Patient  Name: Eric Clark Role: TTS          Eric Clark 01/29/2019 9:15 PM

## 2019-01-29 NOTE — Progress Notes (Signed)
Eric Romp, NP recommends continued observation for safety and stabilization and to be reassessed in the AM by psych. EDP Long, Wonda Olds, MD and Garrison Columbus A, RN have been advised.  Pt tentatively accepted to [facility] OBS 202 pending negative Covid test.  Lind Covert, MSW, [place] Therapeutic Triage Specialist  [phone]

## 2019-01-29 NOTE — ED Provider Notes (Signed)
Emergency Department Provider Note   I have reviewed the triage vital signs and the nursing notes.   HISTORY  Chief Complaint Anxiety Seizures and Depression   HPI Eric Clark is a 33 y.o. male with PMH of EtOH abuse, seizure disorder, SDH s/p evacuation and craniotomy in 2019 presents to the emergency department with increased anxiety and thoughts of hurting others.  Patient states that in the past week he has been locked out of his apartment and lost his car.  He states that he thought about riding his bike to his new job in [place] today but because of rain he could not.  He had someone drive him to the airport where he attempted to rent a car and was unsuccessful at 2 locations.  He states that with the increased stress over the last week he became more agitated and felt like he was "going to snap."  He walked away and began having thoughts about returning to drinking, which she has not done in the past 2 months.  Patient states that he is having fleeting type thoughts of harming others.  He states earlier in the week he had this idea to break into his apartment through the window, get a gun, and rob someone.  He states that he has had these thoughts more frequently in the last week.  He recognizes that they are "not me" and immediately dismisses them but is feeling more unsafe.  He denies suicidal ideation.  He is not having auditory or visual hallucinations.  He has been off all medications for approximately 2 months including his psychiatric and seizure medications. Denies any drug use.   Past Medical History:  Diagnosis Date  . Alcoholism (HCC)   . 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 Seizures (HCC)    last seizure 09/2017  . Subdural hematoma (HCC) 09/2017    Patient Active Problem List   Diagnosis Date Noted  . Severe major depression, single episode (HCC) 10/25/2018    Past Surgical History:  Procedure Laterality  Date  . subdural hematoma removed  2019    Allergies Shellfish allergy and Sunflower oil  Family History  Problem Relation Age of Onset  . Diabetes Mother   . Hypertension Father     Social History Social History   Tobacco Use  . Smoking status: Current Every Day Smoker    Packs/day: 0.50    Types: Cigarettes  . Smokeless tobacco: Never Used  Substance Use Topics  . Alcohol use: Yes    Comment: 01/02/19 2 24oz beers/wk, liquor on weekend  . Drug use: Not Currently    Review of Systems  Constitutional: No fever/chills Eyes: No visual changes. ENT: No sore throat. Cardiovascular: Denies chest pain. Respiratory: Denies shortness of breath. Gastrointestinal: No abdominal pain.  No nausea, no vomiting.  No diarrhea.  No constipation. Genitourinary: Negative for dysuria. Musculoskeletal: Negative for back pain. Skin: Negative for rash. Neurological: Negative for headaches, focal weakness or numbness. Psychiatric: Increased stress, agitation, and fleeting thoughts of harming others.   10-point ROS otherwise negative.  ____________________________________________   PHYSICAL EXAM:  VITAL SIGNS: ED Triage Vitals  Enc Vitals Group     BP 01/29/19 1742 119/87     Pulse Rate 01/29/19 1742 68     Resp 01/29/19 1742 20     Temp 01/29/19 1742 98.6 F (37 C)     Temp Source 01/29/19 1742 Oral     SpO2 01/29/19 1742 100 %  Constitutional: Alert and oriented. Well appearing and in no acute distress. Eyes: Conjunctivae are normal.  Head: Atraumatic. Nose: No congestion/rhinnorhea. Mouth/Throat: Mucous membranes are moist.  Neck: No stridor. Cardiovascular: Normal rate, regular rhythm.  Respiratory: Normal respiratory effort.  Gastrointestinal: No distention.  Musculoskeletal: No gross deformities of extremities. Neurologic:  Normal speech and language.  Skin:  Skin is warm, dry and intact. No rash noted. Psychiatric: Mood and affect are normal. Speech and behavior  are normal.  ____________________________________________   LABS (all labs ordered are listed, but only abnormal results are displayed)  Labs Reviewed  ACETAMINOPHEN LEVEL - Abnormal; Notable for the following components:      Result Value   Acetaminophen (Tylenol), Serum <10 (*)    All other components within normal limits  SARS CORONAVIRUS 2 (TAT 6-24 HRS)  COMPREHENSIVE METABOLIC PANEL  ETHANOL  SALICYLATE LEVEL  CBC WITH DIFFERENTIAL/PLATELET  RAPID URINE DRUG SCREEN, HOSP PERFORMED   ____________________________________________   PROCEDURES  Procedure(s) performed:   Procedures  None  ____________________________________________   INITIAL IMPRESSION / ASSESSMENT AND PLAN / ED COURSE  Pertinent labs & imaging results that were available during my care of the patient were reviewed by me and considered in my medical decision making (see chart for details).   Patient arrives by EMS with increased stress, intermittent agitation, fleeting thoughts of harming others in nonspecific ways.  He denies SI.  I am restarting his seizure medications.  He has not been drinking or using drugs.  Will obtain med clearance labs and discuss with TTS.   Labs reviewed. No acute findings. Patient medically clear and evaluated by TTS. Plan for AM re-evaluation. Will send COVID testing now with admit possible.  ____________________________________________  FINAL CLINICAL IMPRESSION(S) / ED DIAGNOSES  Final diagnoses:  Medical clearance for psychiatric admission     MEDICATIONS GIVEN DURING THIS VISIT:  Medications  levETIRAcetam (KEPPRA) tablet 500 mg (has no administration in time range)  gabapentin (NEURONTIN) capsule 300 mg (has no administration in time range)  nicotine (NICODERM CQ - dosed in mg/24 hours) patch 21 mg (has no administration in time range)  acetaminophen (TYLENOL) tablet 650 mg (has no administration in time range)  ondansetron (ZOFRAN) tablet 4 mg (has no  administration in time range)  alum & mag hydroxide-simeth (MAALOX/MYLANTA) 200-200-20 MG/5ML suspension 30 mL (has no administration in time range)    Note:  This document was prepared using Dragon voice recognition software and may include unintentional dictation errors.  Nanda Quinton, MD, [facility] Emergency [facility]    Long, [facility], MD 01/29/19 2105

## 2019-01-29 NOTE — ED Triage Notes (Signed)
Patient brought in by EMS after he called and said that he felt like he was going to "snap".  Patient has been in rehab for ETOH use at [facility] and recently lost his job, home, children and wife 7 years ago.  He has been sober two months.  He had a grand mal seizure 2 months ago and was put on Keppra and another seizure medication which he can't remember.  Today he was at the airport trying to rent a car and felt like hurting the car rental associate.  He denies suicidal ideation.  He was put on two psychiatric medications in the past Seroquel and Prozac but hasn't taken them recently.  Patient is calm and cooperative at this time.

## 2019-01-29 NOTE — ED Notes (Signed)
TTS consult in progress. °

## 2019-01-30 ENCOUNTER — Observation Stay (HOSPITAL_COMMUNITY)
Admission: AD | Admit: 2019-01-30 | Discharge: 2019-01-30 | Disposition: A | Discharge status: Home / Self Care | Payer: 59 | Source: Other Acute Inpatient Hospital | Attending: Psychiatry | Admitting: Psychiatry

## 2019-01-30 ENCOUNTER — Encounter (HOSPITAL_COMMUNITY): Payer: Self-pay

## 2019-01-30 ENCOUNTER — Other Ambulatory Visit: Payer: Self-pay

## 2019-01-30 ENCOUNTER — Emergency Department (HOSPITAL_COMMUNITY)
Admission: EM | Admit: 2019-01-30 | Discharge: 2019-01-31 | Disposition: A | Discharge status: Home / Self Care | Payer: Self-pay | Attending: Emergency Medicine | Admitting: Emergency Medicine

## 2019-01-30 ENCOUNTER — Ambulatory Visit (HOSPITAL_COMMUNITY): Payer: Self-pay

## 2019-01-30 ENCOUNTER — Encounter (HOSPITAL_COMMUNITY): Payer: Self-pay | Admitting: Emergency Medicine

## 2019-01-30 DIAGNOSIS — R45850 Homicidal ideations: Secondary | ICD-10-CM | POA: Insufficient documentation

## 2019-01-30 DIAGNOSIS — F1021 Alcohol dependence, in remission: Secondary | ICD-10-CM

## 2019-01-30 DIAGNOSIS — F419 Anxiety disorder, unspecified: Secondary | ICD-10-CM | POA: Insufficient documentation

## 2019-01-30 DIAGNOSIS — Z79899 Other long term (current) drug therapy: Secondary | ICD-10-CM | POA: Insufficient documentation

## 2019-01-30 DIAGNOSIS — Z20828 Contact with and (suspected) exposure to other viral communicable diseases: Secondary | ICD-10-CM | POA: Insufficient documentation

## 2019-01-30 DIAGNOSIS — F332 Major depressive disorder, recurrent severe without psychotic features: Principal | ICD-10-CM | POA: Diagnosis present

## 2019-01-30 DIAGNOSIS — F4310 Post-traumatic stress disorder, unspecified: Secondary | ICD-10-CM | POA: Diagnosis present

## 2019-01-30 DIAGNOSIS — Z046 Encounter for general psychiatric examination, requested by authority: Secondary | ICD-10-CM | POA: Insufficient documentation

## 2019-01-30 DIAGNOSIS — F329 Major depressive disorder, single episode, unspecified: Secondary | ICD-10-CM | POA: Insufficient documentation

## 2019-01-30 DIAGNOSIS — F17210 Nicotine dependence, cigarettes, uncomplicated: Secondary | ICD-10-CM | POA: Insufficient documentation

## 2019-01-30 DIAGNOSIS — F4312 Post-traumatic stress disorder, chronic: Secondary | ICD-10-CM | POA: Insufficient documentation

## 2019-01-30 DIAGNOSIS — F411 Generalized anxiety disorder: Secondary | ICD-10-CM | POA: Diagnosis present

## 2019-01-30 DIAGNOSIS — G40909 Epilepsy, unspecified, not intractable, without status epilepticus: Secondary | ICD-10-CM | POA: Insufficient documentation

## 2019-01-30 DIAGNOSIS — Z590 Homelessness: Secondary | ICD-10-CM | POA: Insufficient documentation

## 2019-01-30 LAB — COMPREHENSIVE METABOLIC PANEL
ALT: 17 U/L (ref 0–44)
AST: 21 U/L (ref 15–41)
Albumin: 4.2 g/dL (ref 3.5–5.0)
Alkaline Phosphatase: 55 U/L (ref 38–126)
Anion gap: 11 (ref 5–15)
BUN: 13 mg/dL (ref 6–20)
CO2: 22 mmol/L (ref 22–32)
Calcium: 9 mg/dL (ref 8.9–10.3)
Chloride: 108 mmol/L (ref 98–111)
Creatinine, Ser: 0.99 mg/dL (ref 0.61–1.24)
GFR calc Af Amer: >60 mL/min (ref >60)
GFR calc non Af Amer: >60 mL/min (ref >60)
Glucose, Bld: 91 mg/dL (ref 70–99)
Potassium: 3.3 mmol/L — ABNORMAL LOW (ref 3.5–5.1)
Sodium: 141 mmol/L (ref 135–145)
Total Bilirubin: 0.5 mg/dL (ref 0.3–1.2)
Total Protein: 7 g/dL (ref 6.5–8.1)

## 2019-01-30 LAB — CBC WITH DIFFERENTIAL/PLATELET
Abs Immature Granulocytes: 0.03 10*3/uL (ref 0.00–0.07)
Basophils Absolute: 0 10*3/uL (ref 0.0–0.1)
Basophils Relative: 0 %
Eosinophils Absolute: 0.1 10*3/uL (ref 0.0–0.5)
Eosinophils Relative: 1 %
HCT: 46.3 % (ref 39.0–52.0)
Hemoglobin: 16 g/dL (ref 13.0–17.0)
Immature Granulocytes: 0 %
Lymphocytes Relative: 35 %
Lymphs Abs: 2.5 10*3/uL (ref 0.7–4.0)
MCH: 29.2 pg (ref 26.0–34.0)
MCHC: 34.6 g/dL (ref 30.0–36.0)
MCV: 84.5 fL (ref 80.0–100.0)
Monocytes Absolute: 0.6 10*3/uL (ref 0.1–1.0)
Monocytes Relative: 9 %
Neutro Abs: 3.9 10*3/uL (ref 1.7–7.7)
Neutrophils Relative %: 55 %
Platelets: 202 10*3/uL (ref 150–400)
RBC: 5.48 MIL/uL (ref 4.22–5.81)
RDW: 13.8 % (ref 11.5–15.5)
WBC: 7.2 10*3/uL (ref 4.0–10.5)
nRBC: 0 % (ref 0.0–0.2)

## 2019-01-30 LAB — SARS CORONAVIRUS 2 BY RT PCR (HOSPITAL ORDER, PERFORMED IN ~~LOC~~ HOSPITAL LAB): SARS Coronavirus 2: NEGATIVE

## 2019-01-30 LAB — ETHANOL: Alcohol, Ethyl (B): 189 mg/dL — ABNORMAL HIGH (ref <10)

## 2019-01-30 LAB — RAPID URINE DRUG SCREEN, HOSP PERFORMED
Amphetamines: NOT DETECTED
Barbiturates: NOT DETECTED
Benzodiazepines: NOT DETECTED
Cocaine: NOT DETECTED
Opiates: NOT DETECTED
Tetrahydrocannabinol: NOT DETECTED

## 2019-01-30 MED ORDER — LEVETIRACETAM 500 MG PO TABS: 500.0000 mg | ORAL_TABLET | Freq: Two times a day (BID) | ORAL | 0 refills | Status: AC

## 2019-01-30 MED ORDER — LEVETIRACETAM 500 MG PO TABS
500.0000 mg | ORAL_TABLET | Freq: Two times a day (BID) | ORAL | Status: DC
  Administered 2019-01-30: 500 mg via ORAL
  Filled 2019-01-30: qty 1

## 2019-01-30 MED ORDER — GABAPENTIN 300 MG PO CAPS
300.0000 mg | ORAL_CAPSULE | Freq: Two times a day (BID) | ORAL | Status: DC
  Administered 2019-01-30: 300 mg via ORAL
  Filled 2019-01-30: qty 1

## 2019-01-30 MED ORDER — GABAPENTIN 300 MG PO CAPS: 300.0000 mg | ORAL_CAPSULE | Freq: Two times a day (BID) | ORAL | 0 refills | Status: AC

## 2019-01-30 MED ORDER — ALUM & MAG HYDROXIDE-SIMETH 200-200-20 MG/5ML PO SUSP: 30.0000 mL | ORAL | Status: DC | PRN

## 2019-01-30 MED ORDER — HYDROXYZINE HCL 25 MG PO TABS: 25.0000 mg | ORAL_TABLET | Freq: Three times a day (TID) | ORAL | Status: DC | PRN

## 2019-01-30 MED ORDER — LORAZEPAM 1 MG PO TABS
2.0000 mg | ORAL_TABLET | Freq: Once | ORAL | Status: DC
  Filled 2019-01-30: qty 2

## 2019-01-30 MED ORDER — TRAZODONE HCL 50 MG PO TABS: 50.0000 mg | ORAL_TABLET | Freq: Every evening | ORAL | Status: DC | PRN

## 2019-01-30 MED ORDER — FLUOXETINE HCL 20 MG PO CAPS: 20.0000 mg | ORAL_CAPSULE | Freq: Every day | ORAL | 0 refills | Status: DC

## 2019-01-30 MED ORDER — FLUOXETINE HCL 20 MG PO CAPS: 20.0000 mg | ORAL_CAPSULE | Freq: Every day | ORAL | 0 refills | Status: AC

## 2019-01-30 MED ORDER — FLUOXETINE HCL 20 MG PO CAPS
20.0000 mg | ORAL_CAPSULE | Freq: Every day | ORAL | Status: DC
  Administered 2019-01-30: 20 mg via ORAL
  Filled 2019-01-30: qty 1

## 2019-01-30 MED ORDER — ACETAMINOPHEN 325 MG PO TABS: 650.0000 mg | ORAL_TABLET | Freq: Four times a day (QID) | ORAL | Status: DC | PRN

## 2019-01-30 MED ORDER — NICOTINE 21 MG/24HR TD PT24
21.0000 mg | MEDICATED_PATCH | Freq: Once | TRANSDERMAL | Status: DC
  Administered 2019-01-30: 21 mg via TRANSDERMAL
  Filled 2019-01-30: qty 1

## 2019-01-30 MED ORDER — MAGNESIUM HYDROXIDE 400 MG/5ML PO SUSP: 30.0000 mL | Freq: Every day | ORAL | Status: DC | PRN

## 2019-01-30 NOTE — ED Notes (Signed)
Report given to nurse on OBS unit at [facility], [place] transport called to transfer pt.

## 2019-01-30 NOTE — Discharge Instructions (Signed)
For your behavioral health needs, you are advised to follow up with one of the following providers.  They provide psychiatry, therapy, and substance abuse treatment:  [place]:       [facility], [place] [address]      [phone]      New patients are seen at their walk-in clinic.  Walk-in hours are Monday - Friday from 8:30 am - 12:00 pm, and from 1:00 pm - 2:30 pm.  Walk-in patients are seen on a first come, first served basis, so try to arrive as early as possible for the best chance of being seen the same day.  [place]:       [facility]      [address] [place], [place] [address]      [phone]

## 2019-01-30 NOTE — Progress Notes (Signed)
[place] NOVEL CORONAVIRUS (COVID-19) DAILY CHECK-OFF SYMPTOMS - answer yes or no to each - every day NO YES  Have you had a fever in the past 24 hours?  . Fever (Temp > 37.80C / 100F) X   Have you had any of these symptoms in the past 24 hours? . New Cough .  Sore Throat  .  Shortness of Breath .  Difficulty Breathing .  Unexplained Body Aches   X   Have you had any one of these symptoms in the past 24 hours not related to allergies?   . Runny Nose .  Nasal Congestion .  Sneezing   X   If you have had runny nose, nasal congestion, sneezing in the past 24 hours, has it worsened?  X   EXPOSURES - check yes or no X   Have you traveled outside the state in the past 14 days?  X   Have you been in contact with someone with a confirmed diagnosis of COVID-19 or PUI in the past 14 days without wearing appropriate PPE?  X   Have you been living in the same home as a person with confirmed diagnosis of COVID-19 or a PUI (household contact)?    X   Have you been diagnosed with COVID-19?    X              What to do next: Answered NO to all: Answered YES to anything:   Proceed with unit schedule Follow the [facility] Inpatient Flowsheet.   

## 2019-01-30 NOTE — Patient Outreach (Signed)
CPSS met with the patient on the [facility] OBS unit in order to provide substance use recovery support and provide information for substance use treatment resources. Patient reports a past history of alcohol use. Patient currently has achieved two months of continuous sobriety. Patient is originally from [place], [place] and relocated to [place] earlier in 2020 to take part in [facility] residential substance use treatment program. Patient stayed sober for 5 months following their treatment program and then relapsed two months ago. Patient is interested in getting connected to an [place] for recovery housing at this time. CPSS provided an Designer, [employer]ellery for [employer] area as well as a Hydro[employer]or house services. Patient is also looking for an [place] sponsor, so CPSS provided information for in-person/online AA meetings in [place] and [place]. CPSS also provided CPSS contact information. CPSS strongly encouraged the patient to follow up with CPSS if needed for further substance use recovery support or for help with getting connected to substance use treatment resources.

## 2019-01-30 NOTE — ED Notes (Signed)
Patient refused vitals and blood draw.

## 2019-01-30 NOTE — BH Assessment (Signed)
[place] Assessment Progress Note   Clinician has attempted to call the teleassessment machine several times but no answer.  Called to triage area but no answer.

## 2019-01-30 NOTE — Progress Notes (Signed)
Received Eric Clark from the main ED, he immediately went to bed. The sitter is at the bedside. He slept throughout the night.

## 2019-01-30 NOTE — Progress Notes (Signed)
D: Pt A & O X 4. Presents with sullen affect, soft speech and fair eye contact. Denies SI, HI, AVH and pain at this time. Per pt "I'm just stressed and depressed, I lost my job and my car, could not see my daughter for her birthday, but I feel better after getting some sleep". D/C home as ordered. Instructions provided to the bus stop.  A: D/C instructions reviewed with pt including electronic prescriptions and follow up appointments; compliance encouraged. All belongings from locker #2 returned to pt at time of departure. Scheduled medications given with verbal education and effects monitored. Safety checks maintained without incident till time of d/c.  R: Pt receptive to care. Compliant with medications when offered. Denies adverse drug reactions when assessed. Verbalized understanding related to d/c instructions. Signed belonging sheet in agreement with items received from locker. Ambulatory with a steady gait. Appears to be in no physical distress at time of d/c.

## 2019-01-30 NOTE — H&P (Signed)
BH Observation Unit Provider Admission PAA/H&P  Patient Identification: Eric Clark MRN:  [id number] Date of Evaluation:  01/30/2019 Chief Complaint:  MDD Sigle Episode Etoh Use Disorder Principal Diagnosis: Severe recurrent major depression without psychotic features (HCC) Diagnosis:  Principal Problem:   Severe recurrent major depression without psychotic features (HCC) Active Problems:   Generalized anxiety disorder   PTSD (post-traumatic stress disorder)   Alcohol use disorder, severe, in early remission, dependence (HCC)  History of Present Illness:  TTS Assessment:  Eric Clark is an [age] y.o. male who presents to the ED voluntarily. Pt reports he has been experiencing increased stressors over the past week and feels unstable. Pt states he feels that he is on the verge of a breakdown and states if he does not get help, he feels that he is going to hurt someone. Pt states he lost his apartment 2 days ago and it was padlocked so he is now homeless. Pt states he does not have a car and he was trying to rent a car but was unsuccessful. Pt wanted to ride his bike to his job in [place] today but because of the rain, he did not. Pt states he feels there has been one thing after another and he feels like he is having a breakdown. Pt states he thought about hurting the guy at the car rental company. Pt states he walked away and kept walking aimlessly for several hours. Pt states he "felt crazy" and knew that he needed help. Pt states he has a court date in December due to DUI.   Evaluation on Unit: Reviewed TTS assessment and validated with patient. On evaluation patient is alert and oriented x 4, pleasant, and cooperative. Speech is clear and coherent. Mood is depressed and affect is congruent with mood. Thought process is coherent and thought content is logical. Denies suicidal ideations. Denies homicidal ideations. Denies current substance abuse. States that he has not had any alcohol since he  was [facility]. Denies audiovisual hallucinations. Reports that he had hallucinations when he had a seizure, but denies otherwise. No indication that patient is responding to internal stimuli. States that he feels that he is at his "breaking point" and would like to restart medications.      Associated Signs/Symptoms: Depression Symptoms:  depressed mood, anhedonia, insomnia, psychomotor agitation, feelings of worthlessness/guilt, hopelessness, anxiety, (Hypo) Manic Symptoms:  Irritable Mood, Anxiety Symptoms:  Excessive Worry, Psychotic Symptoms:  Denies PTSD Symptoms: Negative Total Time spent with patient: 20 minutes  Past Psychiatric History: MDD, GAD, PTSD, Alcohol Use Disorder  Is the patient at risk to self? No.  Has the patient been a risk to self in the past 6 months? Yes.    Has the patient been a risk to self within the distant past? Yes.    Is the patient a risk to others? No.  Has the patient been a risk to others in the past 6 months? No.  Has the patient been a risk to others within the distant past? No.   Prior Inpatient Therapy:   Prior Outpatient Therapy:    Alcohol Screening:   Substance Abuse History in the last 12 months:  Yes.   Consequences of Substance Abuse: Withdrawal Symptoms:   Headaches Nausea Tremors Vomiting Previous Psychotropic Medications: Yes  Psychological Evaluations: No  Past Medical History:  Past Medical History:  Diagnosis Date  . Alcoholism (HCC)   . 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  Seizures ([place])    last seizure 09/2017  . Subdural hematoma ([place]) 09/2017    Past Surgical History:  Procedure Laterality Date  . subdural hematoma removed  2019   Family History:  Family History  Problem Relation Age of Onset  . Diabetes Mother   . Hypertension Father    Family Psychiatric History:  Tobacco Screening:   Social History:  Social History    Substance and Sexual Activity  Alcohol Use Yes   Comment: 01/02/19 2 24oz beers/wk, liquor on weekend     Social History   Substance and Sexual Activity  Drug Use Not Currently    Additional Social History:                           Allergies:   Allergies  Allergen Reactions  . Shellfish Allergy Anaphylaxis  . Sunflower Oil Anaphylaxis, Itching, Palpitations, Shortness Of Breath, Swelling and Hives   Lab Results:  Results for orders placed or performed during the hospital encounter of 01/29/19 (from the past 48 hour(s))  Urine rapid drug screen (hosp performed)     Status: None   Collection Time: 01/29/19  5:56 PM  Result Value Ref Range   Opiates NONE DETECTED NONE DETECTED   Cocaine NONE DETECTED NONE DETECTED   Benzodiazepines NONE DETECTED NONE DETECTED   Amphetamines NONE DETECTED NONE DETECTED   Tetrahydrocannabinol NONE DETECTED NONE DETECTED   Barbiturates NONE DETECTED NONE DETECTED    Comment: (NOTE) DRUG SCREEN FOR MEDICAL PURPOSES ONLY.  IF CONFIRMATION IS NEEDED FOR ANY PURPOSE, NOTIFY LAB WITHIN 5 DAYS. LOWEST DETECTABLE LIMITS FOR URINE DRUG SCREEN Drug Class                     Cutoff (ng/mL) Amphetamine and metabolites    1000 Barbiturate and metabolites    200 Benzodiazepine                 903 Tricyclics and metabolites     300 Opiates and metabolites        300 Cocaine and metabolites        300 THC                            50 Performed at [facility], [place] [address], [place], [place] [address]   Comprehensive metabolic panel     Status: None   Collection Time: 01/29/19  6:08 PM  Result Value Ref Range   Sodium 138 135 - 145 mmol/L   Potassium 3.8 3.5 - 5.1 mmol/L   Chloride 105 98 - 111 mmol/L   CO2 22 22 - 32 mmol/L   Glucose, Bld 85 70 - 99 mg/dL   BUN 10 6 - 20 mg/dL   Creatinine, Ser 0.94 0.61 - 1.24 mg/dL   Calcium 9.1 8.9 - 10.3 mg/dL   Total Protein 6.7 6.5 - 8.1 g/dL   Albumin 4.1 3.5 - 5.0  g/dL   AST 21 15 - 41 U/L   ALT 16 0 - 44 U/L   Alkaline Phosphatase 49 38 - 126 U/L   Total Bilirubin 0.9 0.3 - 1.2 mg/dL   GFR calc non Af Amer >60 >60 mL/min   GFR calc Af Amer >60 >60 mL/min   Anion gap 11 5 - 15    Comment: Performed at [facility], [place] [address] [place], [place] [address]  Acetaminophen level     Status: Abnormal   Collection Time: 01/29/19  6:08 PM  Result Value Ref Range   Acetaminophen (Tylenol), Serum <10 (L) 10 - 30 ug/mL    Comment: (NOTE) Therapeutic concentrations vary significantly. A range of 10-30 ug/mL  may be an effective concentration for many patients. However, some  are best treated at concentrations outside of this range. Acetaminophen concentrations >150 ug/mL at 4 hours after ingestion  and >50 ug/mL at 12 hours after ingestion are often associated with  toxic reactions. Performed at [facility], [address], [place], [place] [address]   Ethanol     Status: None   Collection Time: 01/29/19  6:08 PM  Result Value Ref Range   Alcohol, Ethyl (B) <10 <10 mg/dL    Comment: (NOTE) Lowest detectable limit for serum alcohol is 10 mg/dL. For medical purposes only. Performed at [facility], [address], [place], [place] [address]   Salicylate level     Status: None   Collection Time: 01/29/19  6:08 PM  Result Value Ref Range   Salicylate Lvl <7.0 2.8 - 30.0 mg/dL    Comment: Performed at [facility], [address], [place], [place] [address]  CBC with Differential     Status: None   Collection Time: 01/29/19  6:08 PM  Result Value Ref Range   WBC 7.3 4.0 - 10.5 K/uL   RBC 5.34 4.22 - 5.81 MIL/uL   Hemoglobin 15.6 13.0 - 17.0 g/dL   HCT 38.7 56.4 - 33.2 %   MCV 84.5 80.0 - 100.0 fL   MCH 29.2 26.0 - 34.0 pg   MCHC 34.6 30.0 - 36.0 g/dL   RDW 95.1 88.4 - 16.6 %   Platelets 193 150 - 400 K/uL   nRBC 0.0 0.0 - 0.2 %   Neutrophils Relative % 54 %    Neutro Abs 4.0 1.7 - 7.7 K/uL   Lymphocytes Relative 34 %   Lymphs Abs 2.5 0.7 - 4.0 K/uL   Monocytes Relative 8 %   Monocytes Absolute 0.6 0.1 - 1.0 K/uL   Eosinophils Relative 3 %   Eosinophils Absolute 0.2 0.0 - 0.5 K/uL   Basophils Relative 1 %   Basophils Absolute 0.0 0.0 - 0.1 K/uL   Immature Granulocytes 0 %   Abs Immature Granulocytes 0.02 0.00 - 0.07 K/uL    Comment: Performed at [facility], [address], [place], [place] [address]  SARS Coronavirus 2 by RT PCR (hospital order, performed in [facility] hospital lab) Nasopharyngeal Nasopharyngeal Swab     Status: None   Collection Time: 01/30/19 12:16 AM   Specimen: Nasopharyngeal Swab  Result Value Ref Range   SARS Coronavirus 2 NEGATIVE NEGATIVE    Comment: (NOTE) If result is NEGATIVE SARS-CoV-2 target nucleic acids are NOT DETECTED. The SARS-CoV-2 RNA is generally detectable in upper and lower  respiratory specimens during the acute phase of infection. The lowest  concentration of SARS-CoV-2 viral copies this assay can detect is 250  copies / mL. A negative result does not preclude SARS-CoV-2 infection  and should not be used as the sole basis for treatment or other  patient management decisions.  A negative result may occur with  improper specimen collection / handling, submission of specimen other  than nasopharyngeal swab, presence of viral mutation(s) within the  areas targeted by this assay, and inadequate number of viral copies  (<250 copies / mL). A  negative result must be combined with clinical  observations, patient history, and epidemiological information. If result is POSITIVE SARS-CoV-2 target nucleic acids are DETECTED. The SARS-CoV-2 RNA is generally detectable in upper and lower  respiratory specimens dur ing the acute phase of infection.  Positive  results are indicative of active infection with SARS-CoV-2.  Clinical  correlation with patient history and other diagnostic  information is  necessary to determine patient infection status.  Positive results do  not rule out bacterial infection or co-infection with other viruses. If result is PRESUMPTIVE POSTIVE SARS-CoV-2 nucleic acids MAY BE PRESENT.   A presumptive positive result was obtained on the submitted specimen  and confirmed on repeat testing.  While 2019 novel coronavirus  (SARS-CoV-2) nucleic acids may be present in the submitted sample  additional confirmatory testing may be necessary for epidemiological  and / or clinical management purposes  to differentiate between  SARS-CoV-2 and other Sarbecovirus currently known to infect humans.  If clinically indicated additional testing with an alternate test  methodology [phone]) is advised. The SARS-CoV-2 RNA is generally  detectable in upper and lower respiratory sp ecimens during the acute  phase of infection. The expected result is Negative. Fact Sheet for Patients:  BoilerBrush.com.cy Fact Sheet for Healthcare Providers: https://pope.com/ This test is not yet approved or cleared by the [place] FDA and has been authorized for detection and/or diagnosis of SARS-CoV-2 by FDA under an Emergency Use Authorization (EUA).  This EUA will remain in effect (meaning this test can be used) for the duration of the COVID-19 declaration under Section 564(b)(1) of the Act, 21 U.S.C. section 360bbb-3(b)(1), unless the authorization is terminated or revoked sooner. Performed at [facility], [address] [place], [place] [address]     Blood Alcohol level:  Lab Results  Component Value Date   ETH <10 01/29/2019   ETH 322 (HH) 10/24/2018    Metabolic Disorder Labs:  Lab Results  Component Value Date   HGBA1C 5.0 10/26/2018   MPG 97 10/26/2018   No results found for: PROLACTIN Lab Results  Component Value Date   CHOL 174 10/26/2018   TRIG 93 10/26/2018   HDL 94 10/26/2018    CHOLHDL 1.9 10/26/2018   VLDL 19 10/26/2018   LDLCALC 61 10/26/2018    Current Medications: Current Facility-Administered Medications  Medication Dose Route Frequency Provider Last Rate Last Dose  . acetaminophen (TYLENOL) tablet 650 mg  650 mg Oral Q6H PRN Jackelyn Poling, NP      . alum & mag hydroxide-simeth (MAALOX/MYLANTA) 200-200-20 MG/5ML suspension 30 mL  30 mL Oral Q4H PRN Nira Conn A, NP      . FLUoxetine (PROZAC) capsule 20 mg  20 mg Oral Daily Nira Conn A, NP      . gabapentin (NEURONTIN) capsule 300 mg  300 mg Oral BID Nira Conn A, NP      . hydrOXYzine (ATARAX/VISTARIL) tablet 25 mg  25 mg Oral TID PRN Jackelyn Poling, NP      . levETIRAcetam (KEPPRA) tablet 500 mg  500 mg Oral BID Nira Conn A, NP      . magnesium hydroxide (MILK OF MAGNESIA) suspension 30 mL  30 mL Oral Daily PRN Nira Conn A, NP      . traZODone (DESYREL) tablet 50 mg  50 mg Oral QHS PRN Jackelyn Poling, NP       PTA Medications: Medications Prior to Admission  Medication Sig Dispense Refill Last Dose  .  FLUoxetine (PROZAC) 20 MG capsule Take 1 capsule (20 mg total) by mouth daily. 90 capsule 2   . gabapentin (NEURONTIN) 300 MG capsule Take 1 capsule (300 mg total) by mouth 2 (two) times daily. 60 capsule 12     Musculoskeletal: Strength & Muscle Tone: within normal limits Gait & Station: normal Patient leans: N/A  Psychiatric Specialty Exam: Physical Exam  Constitutional: He is oriented to person, place, and time. He appears well-developed and well-nourished. No distress.  HENT:  Head: Normocephalic and atraumatic.  Right Ear: External ear normal.  Left Ear: External ear normal.  Eyes: Right eye exhibits no discharge. Left eye exhibits no discharge.  Respiratory: Effort normal. No respiratory distress.  Musculoskeletal: Normal range of motion.  Neurological: He is alert and oriented to person, place, and time.  Skin: He is not diaphoretic.  Psychiatric: His mood appears anxious.  He is not withdrawn and not actively hallucinating. Thought content is not paranoid and not delusional. He exhibits a depressed mood. He expresses no homicidal and no suicidal ideation.    Review of Systems  Constitutional: Negative for chills, diaphoresis, fever, malaise/fatigue and weight loss.  Respiratory: Negative for cough and shortness of breath.   Cardiovascular: Negative for chest pain.  Gastrointestinal: Negative for diarrhea, nausea and vomiting.  Neurological: Negative for seizures and headaches.  Psychiatric/Behavioral: Positive for depression. Negative for hallucinations, memory loss, substance abuse and suicidal ideas. The patient is nervous/anxious and has insomnia.     There were no vitals taken for this visit.There is no height or weight on file to calculate BMI.  General Appearance: Casual and Fairly Groomed  Eye Contact:  Fair  Speech:  Clear and Coherent and Normal Rate  Volume:  Decreased  Mood:  Anxious, Depressed, Irritable and Worthless  Affect:  Congruent and Depressed  Thought Process:  Coherent, Goal Directed and Descriptions of Associations: Intact  Orientation:  Full (Time, Place, and Person)  Thought Content:  Logical  Suicidal Thoughts:  No  Homicidal Thoughts:  No  Memory:  Immediate;   Good Recent;   Good  Judgement:  Intact  Insight:  Lacking  Psychomotor Activity:  Normal  Concentration:  Concentration: Good and Attention Span: Good  Recall:  Good  Fund of Knowledge:  Good  Language:  Good  Akathisia:  Negative  Handed:  Right  AIMS (if indicated):     Assets:  Communication Skills Desire for Improvement Leisure Time Physical Health  ADL's:  Intact  Cognition:  WNL  Sleep:         Treatment Plan Summary: Daily contact with patient to assess and evaluate symptoms and progress in treatment and Medication management  Observation Level/Precautions:  15 minute checks Laboratory:  See ED labs Psychotherapy:  Individual  Medications:    Keppra 500 mg BID for seizure disorder Gabapentin 300 mg BID for anxiety Prozac 20 mg daily for depression  Consultations:  social work Discharge Concerns:  Safety/housing Estimated LOS: Other:      Jackelyn Poling, NP 10/12/20203:17 AM
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
## 2019-01-30 NOTE — BH Assessment (Signed)
[place] Assessment Progress Note  Per Shuvon Rankin, FNP, this pt does not require psychiatric hospitalization at this time.  Pt is to be discharged from the [facility] Observation Unit with referral information for area mental health and substace abuse treatment providers.  Discharge instructions include information for [facility] [place] in [place], and for [facility] in [place].  Pt would also benefit from seeing Peer Support Specialists; they will be asked to speak to pt.  Pt's nurse, Nicoletta Dress, has been notified.  Jalene Mullet, [place] Triage Specialist [phone]

## 2019-01-30 NOTE — Discharge Summary (Addendum)
[facility]  01/30/2019 10:13 AM Eric DusBradley Clark  MRN:  [id number] Principal Problem: Severe recurrent major depression without psychotic features [facility](HCC) Discharge Diagnoses: Active Problems:   Generalized anxiety disorder   PTSD (post-traumatic stress disorder)   Alcohol use disorder, severe, in early remission, dependence (HCC)   Subjective: Per Dr. Lucianne MussKumar:  Patient seen, evaluated by me this morning.  Patient reports that he is not suicidal, homicidal or psychotic.  He has that he lost his apartment, his job this past week, was unable to see his daughter on her birthday as she lives with his parents and he agreed.  Patient has that he has been sober for a few months now, wanted to drink, needs help to get an AA counselor.  Recommend peer support to help patient keep his sobriety.  Patient also reports that he has a new job in [place], will contact them and let them know that he will start tomorrow.  We are supportive also help with list of shelters for patient to help keep him stable.  Patient denies any psychotic symptoms, any thoughts of self-harm or harm to others.  Patient states that he came to the hospital because he wanted help, slept well last night, is stable and does not need inpatient psychiatric care.  Patient states that he would never do anything to harm himself as he loves his daughter, wants to be there to see her grow up.  Patient also denies any access to weapons  Total Time spent with patient: 30 minutes  Past Psychiatric History: GAD, PTSD, MDD  Past Medical History:  Past Medical History:  Diagnosis Date  . Alcoholism (HCC)   . 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 Seizures (HCC)    last seizure 09/2017  . Subdural hematoma (HCC) 09/2017    Past Surgical History:  Procedure Laterality Date  . subdural hematoma removed  2019   Family History:  Family History  Problem Relation Age of Onset   . Diabetes Mother   . Hypertension Father    Family Psychiatric  History: Denies Social History:  Social History   Substance and Sexual Activity  Alcohol Use Yes   Comment: 01/02/19 2 24oz beers/wk, liquor on weekend     Social History   Substance and Sexual Activity  Drug Use Not Currently    Social History   Socioeconomic History  . Marital status: Single    Spouse name: Not on file  . Number of children: 1  . Years of education: 4312  . Highest education level: Not on file  Occupational History    Comment: [employer]  Social Needs  . Financial resource strain: Not on file  . Food insecurity    Worry: Not on file    Inability: Not on file  . Transportation needs    Medical: Not on file    Non-medical: Not on file  Tobacco Use  . Smoking status: Current Every Day Smoker    Packs/day: 0.50    Types: Cigarettes  . Smokeless tobacco: Never Used  Substance and Sexual Activity  . Alcohol use: Yes    Comment: 01/02/19 2 24oz beers/wk, liquor on weekend  . Drug use: Not Currently  . Sexual activity: Not Currently  Lifestyle  . Physical activity    Days per week: Not on file    Minutes per session: Not on file  . Stress: Not on file  Relationships  . Social connections  Talks on phone: Not on file    Gets together: Not on file    Attends religious service: Not on file    Active member of club or organization: Not on file    Attends meetings of clubs or organizations: Not on file    Relationship status: Not on file  Other Topics Concern  . Not on file  Social History Narrative   Lives alone   Caffeine- coffee, 2 cups, some caffeine later    Has this patient used any form of tobacco in the last 30 days? (Cigarettes, Smokeless Tobacco, Cigars, and/or Pipes) A prescription for an FDA-approved tobacco cessation medication was offered at discharge and the patient refused  Current Medications: Current Facility-Administered Medications  Medication Dose Route  Frequency Provider Last Rate Last Dose  . acetaminophen (TYLENOL) tablet 650 mg  650 mg Oral Q6H PRN Nira Conn A, NP      . alum & mag hydroxide-simeth (MAALOX/MYLANTA) 200-200-20 MG/5ML suspension 30 mL  30 mL Oral Q4H PRN Nira Conn A, NP      . FLUoxetine (PROZAC) capsule 20 mg  20 mg Oral Daily Nira Conn A, NP   20 mg at 01/30/19 0852  . gabapentin (NEURONTIN) capsule 300 mg  300 mg Oral BID Nira Conn A, NP   300 mg at 01/30/19 4536  . hydrOXYzine (ATARAX/VISTARIL) tablet 25 mg  25 mg Oral TID PRN Jackelyn Poling, NP      . levETIRAcetam (KEPPRA) tablet 500 mg  500 mg Oral BID Nira Conn A, NP   500 mg at 01/30/19 0852  . magnesium hydroxide (MILK OF MAGNESIA) suspension 30 mL  30 mL Oral Daily PRN Nira Conn A, NP      . traZODone (DESYREL) tablet 50 mg  50 mg Oral QHS PRN Jackelyn Poling, NP       PTA Medications: Medications Prior to Admission  Medication Sig Dispense Refill Last Dose  . [DISCONTINUED] gabapentin (NEURONTIN) 300 MG capsule Take 1 capsule (300 mg total) by mouth 2 (two) times daily. 60 capsule 12 01/30/2019 at Unknown time  . [DISCONTINUED] FLUoxetine (PROZAC) 20 MG capsule Take 1 capsule (20 mg total) by mouth daily. 90 capsule 2     Musculoskeletal: Strength & Muscle Tone: within normal limits Gait & Station: normal Patient leans: N/A  Psychiatric Specialty Exam: Physical Exam  Nursing note and vitals reviewed. Constitutional: He is oriented to person, place, and time. He appears well-nourished. No distress.  Neck: Normal range of motion.  Respiratory: Effort normal.  Musculoskeletal: Normal range of motion.  Neurological: He is alert and oriented to person, place, and time.  Skin: Skin is warm and dry.  Psychiatric: He has a normal mood and affect. His speech is normal and behavior is normal. Judgment and thought content normal. Cognition and memory are normal.    Review of Systems  Psychiatric/Behavioral: Depression: Stable. Hallucinations:  Denies. Memory loss: Denies. Substance abuse: Denies. Suicidal ideas: Denies. Nervous/anxious: Stable. Insomnia: Denies.   All other systems reviewed and are negative.   Blood pressure 111/88, pulse 87, temperature 97.9 F (36.6 C), temperature source Oral, resp. rate 18, SpO2 98 %.There is no height or weight on file to calculate BMI.  General Appearance: Casual  Eye Contact:  Good  Speech:  Clear and Coherent and Normal Rate  Volume:  Normal  Mood:  "Good" Appropriate  Affect:  Appropriate and Congruent  Thought Process:  Coherent and Goal Directed  Orientation:  Full (Time, Place,  and Person)  Thought Content:  WDL  Suicidal Thoughts:  No  Homicidal Thoughts:  No  Memory:  Immediate;   Good Recent;   Good  Judgement:  Intact  Insight:  Present  Psychomotor Activity:  Normal  Concentration:  Concentration: Good and Attention Span: Good  Recall:  Good  Fund of Knowledge:  Good  Language:  Good  Akathisia:  No  Handed:  Right  AIMS (if indicated):     Assets:  Communication Skills Desire for Improvement Physical Health Social Support  ADL's:  Intact  Cognition:  WNL  Sleep:        Demographic Factors:  Male  Loss Factors: Financial problems/change in socioeconomic status  Historical Factors: Impulsivity  Risk Reduction Factors:   Sense of responsibility to family, Employed and Positive social support  Continued Clinical Symptoms:  Previous Psychiatric Diagnoses and Treatments  Cognitive Features That Contribute To Risk:  None    Suicide Risk:  Minimal: No identifiable suicidal ideation.  Patients presenting with no risk factors but with morbid ruminations; may be classified as minimal risk based on the severity of the depressive symptoms    Plan Of Care/Follow-up recommendations:  Activity:  As tolerated Diet:  Heart healthy   Disposition: No evidence of imminent risk to self or others at present.   Patient does not meet criteria for psychiatric  inpatient admission. Supportive therapy provided about ongoing stressors. Discussed crisis plan, support from social network, calling 911, coming to the Emergency Department, and calling Suicide Hotline.   Eric Boivin, NP 01/30/2019, 10:13 AM
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
## 2019-01-30 NOTE — Plan of Care (Signed)
[place]  Reason for Crisis Plan:  Crisis Stabilization   Plan of Care:  Referral for Inpatient Hospitalization  Family Support:      Current Living Environment:     Insurance:   Hospital Account    Name Acct ID Class Status Primary Coverage   Eric, Clark [id number] [employer] None        Guarantor Account (for Hospital Account [id number])    Name Relation to Pt Service Area Active? Acct Type   Eric Clark Self [facility] Yes Behavioral Health   Address Phone       [address] [place], [place] [address] [phone])          Coverage Information (for Hospital Account [id number])    Not on file      Legal Guardian:     Primary Care Provider:  Patient, No Pcp Per  Current Outpatient Providers:  Eric Abbot, MD  Psychiatrist:     Counselor/Therapist:     Compliant with Medications:  No  Additional Information:   Eric Clark 10/12/20204:52 AM

## 2019-01-30 NOTE — ED Notes (Signed)
Pt refuses to let this writer take vital signs. Myself and security explained to him what I will be doing. Patient states " hell to the no. Don't ya'll think my vitals will be off because im upset and angry". When asked a second time could this writer take his vitals, the patient responded "hell no."

## 2019-01-31 ENCOUNTER — Encounter (HOSPITAL_COMMUNITY): Payer: Self-pay | Admitting: Registered Nurse

## 2019-01-31 LAB — SARS CORONAVIRUS 2 BY RT PCR (HOSPITAL ORDER, PERFORMED IN ~~LOC~~ HOSPITAL LAB): SARS Coronavirus 2: NEGATIVE

## 2019-01-31 NOTE — BH Assessment (Signed)
[facility] Assessment Progress Note  Per Hampton Abbot, MD, this pt does not require psychiatric hospitalization at this time.  Pt presents under IVC initiated by EDP Nat Christen, MD, which has been rescinded by Orvis Brill, LCSW.  Pt is to be discharged from [facility] with recommendation to follow up with [facility], the [facility] serving [place].  This has been included in pt's discharge instructions.  Arbie Cookey, CSW agrees to address pt's transportation needs to return to his home in that area.  Pt would also benefit from seeing Peer Support Specialists; they will be asked to speak to pt.  Pt's nurse, Nena Jordan, has been notified.  Jalene Mullet, [place] Triage Specialist [phone]

## 2019-01-31 NOTE — Progress Notes (Signed)
CSW spoke with patient to assist him with transportation. CSW arranged for an Melburn Popper to retrieve the patient from [facility] ED and take him to the [employer] station in [place] to get on the bus from [place] to [place]. CSW arranged for Melburn Popper via the transportation department and bus ticket via [employer].  Patient's bus ticket and rider waiver were faxed to [place], [place] for completion. Patient's expected to be picked up via [employer] Popper at 11:45am from [facility] ED. CSW provided the transportation department with patient's phone number - [phone].  Madilyn Fireman, MSW, LCSW-A Transitions of Care  Clinical Social Worker  [facility] Emergency Departments  Medical ICU [phone]

## 2019-01-31 NOTE — Discharge Instructions (Signed)
For your behavioral health needs, you are advised to follow up with [facility].  They will provide you with access to any mental health or substance abuse treatment resources that you may need.  Please note that their number also serves as a 24 hour crisis number for the [facility] area:       [employer]      [phone]
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
## 2019-01-31 NOTE — ED Notes (Signed)
Pt was given [employer] and Melburn Popper was called by social work.  Pt is dressed and discharged safely with resources for [facility].  Pt was in no distress.  All belongings were returned to pt.
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
## 2019-07-05 ENCOUNTER — Ambulatory Visit: Payer: Self-pay | Admitting: Diagnostic Neuroimaging

## 2019-07-05 ENCOUNTER — Telehealth: Payer: Self-pay | Admitting: *Deleted

## 2019-07-05 NOTE — Telephone Encounter (Signed)
Patient arrived for 2:30 pm appointment at 2:41 pm. He was offered to reschedule but stated he would call back to reschedule.

## 2019-07-06 ENCOUNTER — Encounter: Payer: Self-pay | Admitting: Diagnostic Neuroimaging

## 2020-08-11 IMAGING — CT CT HEAD WITHOUT CONTRAST
4 series · 16 of 47 positions shown, 18 images · non-contrast
Comparison: CT head 10/19/2018

CLINICAL DATA: Headache right temporal area. Craniotomy for
subdural hematoma last year

EXAM:
CT HEAD WITHOUT CONTRAST
TECHNIQUE: Contiguous axial images were obtained from the base of the skull
through the vertex without intravenous contrast.

[Series 3: head without · axial · non-contrast · 0.45mm/px · z∈[-86,+34]mm · 7 of 33 slices shown, 9 images]
[im 5/33  brain]
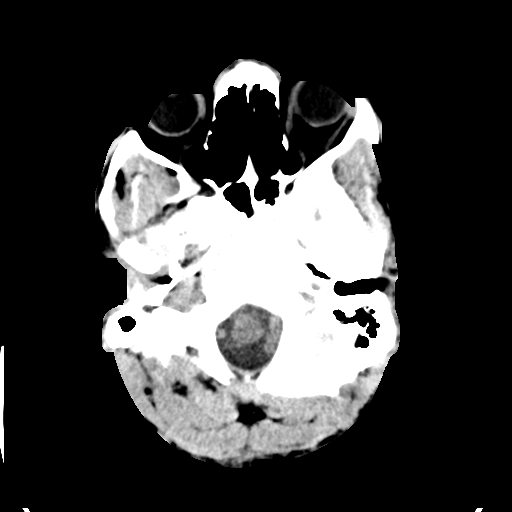
[im 5/33  bone]
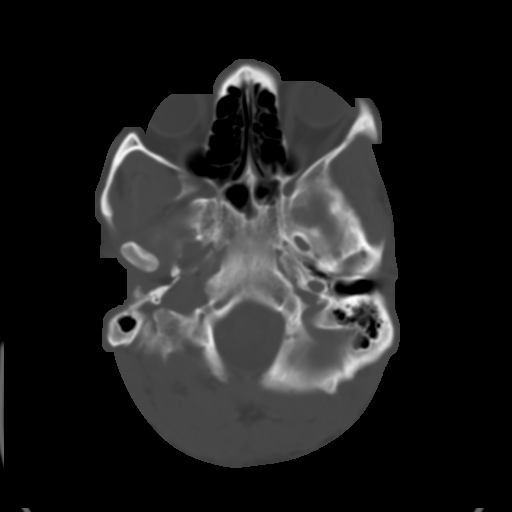
[im 9/33  brain]
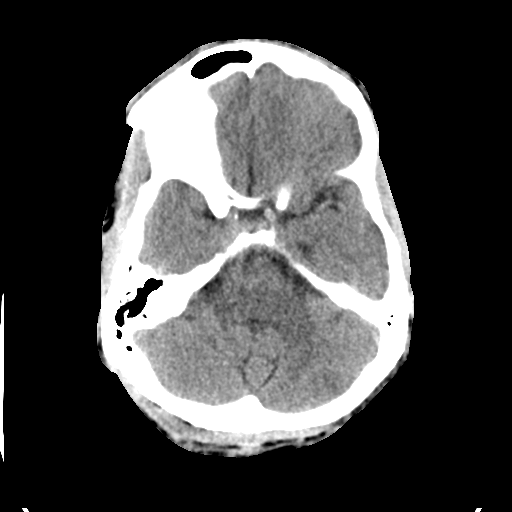
[im 13/33  brain]
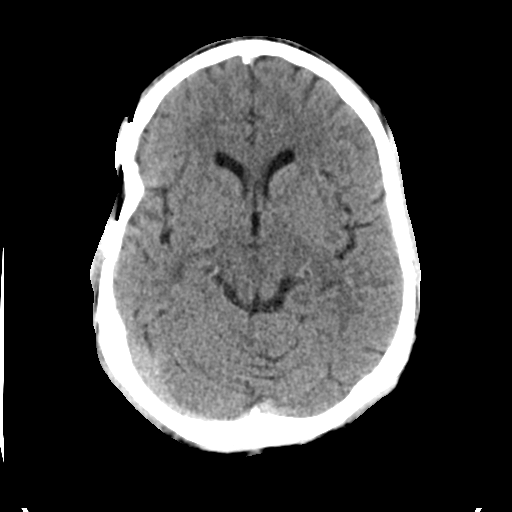
[im 17/33  brain]
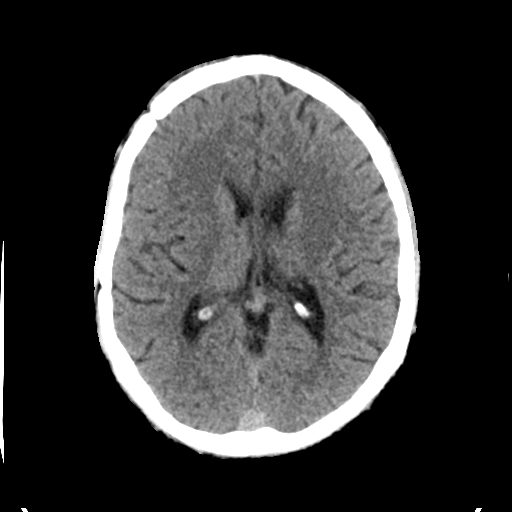
[im 21/33  brain]
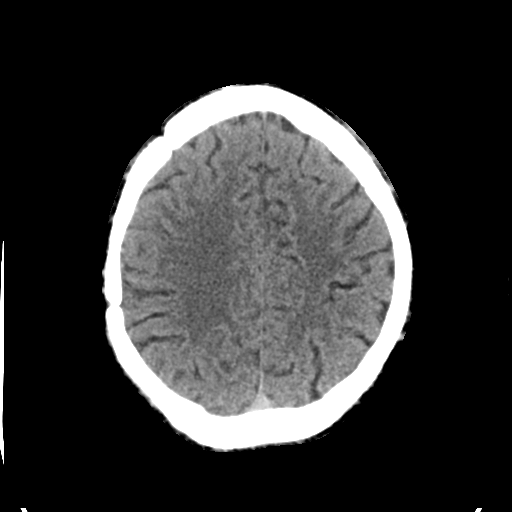
[im 21/33  bone]
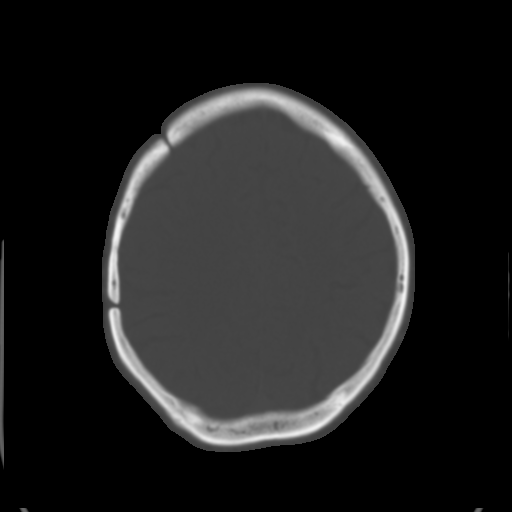
[im 25/33  brain]
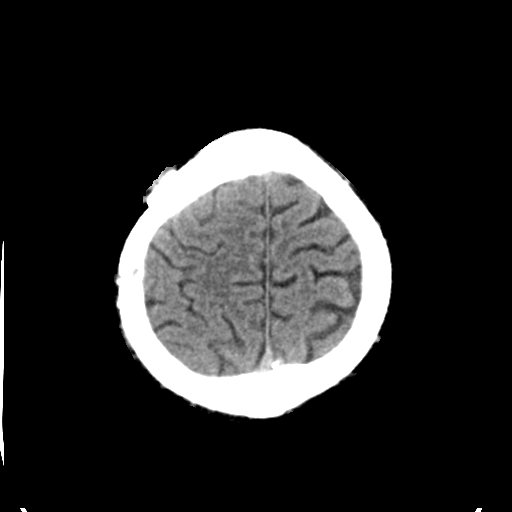
[im 29/33  brain]
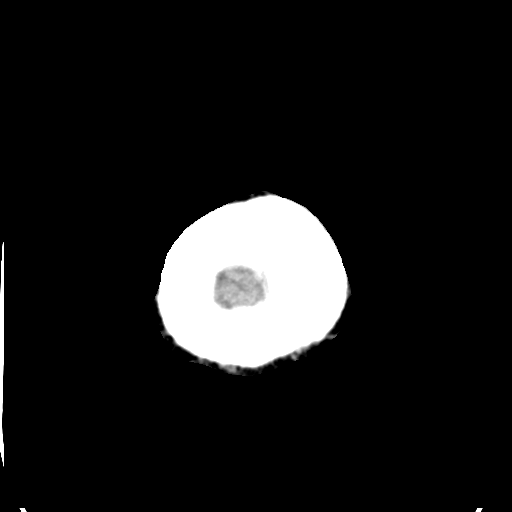

[Series 4: head bone · axial · 0.45mm/px · z∈[-90,-58]mm · 3 of 83 slices shown]
[im 9/83  bone]
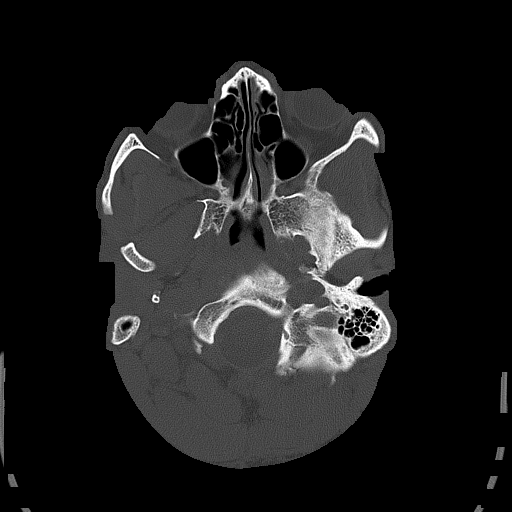
[im 17/83  bone]
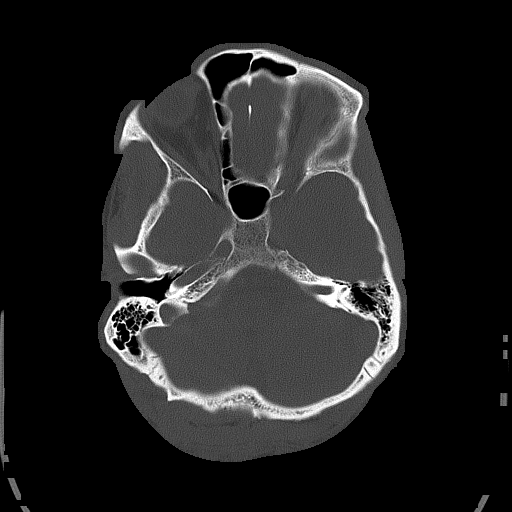
[im 25/83  bone]
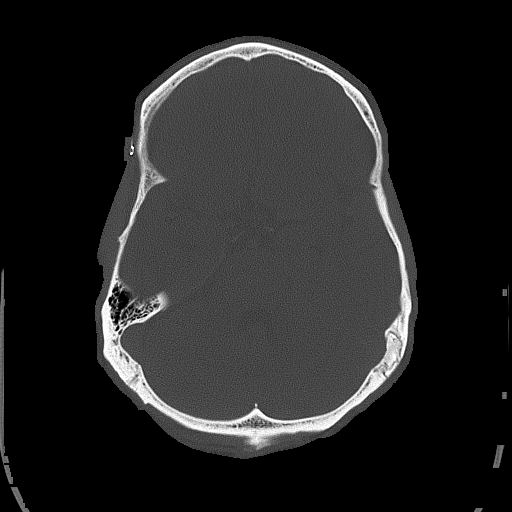

[Series 5: head without cor · coronal · non-contrast · 0.32mm/px · 3 of 70 slices shown]
[im 24/70  brain]
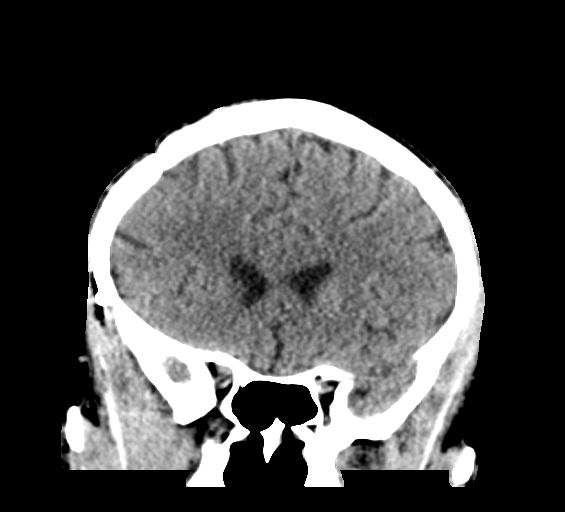
[im 31/70  brain]
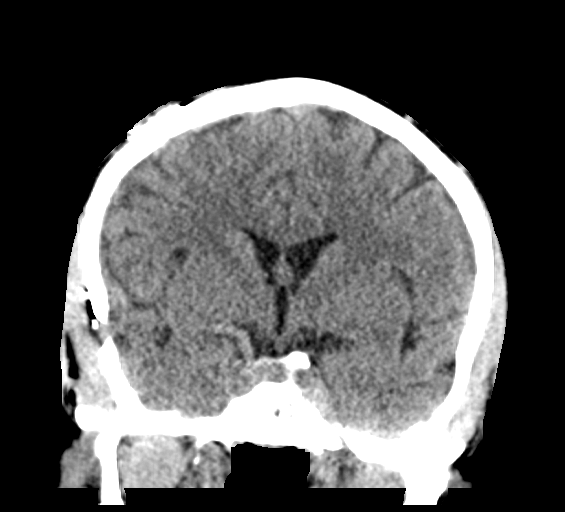
[im 39/70  brain]
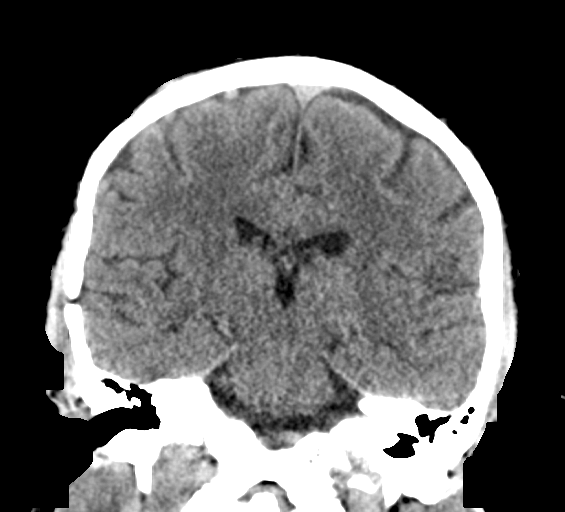

[Series 6: head without sag · sagittal · non-contrast · 0.30mm/px · 3 of 67 slices shown]
[im 23/67  brain]
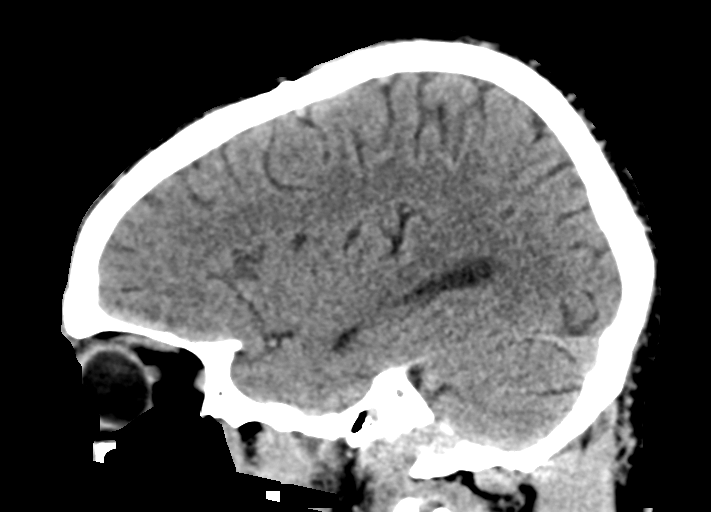
[im 34/67  brain]
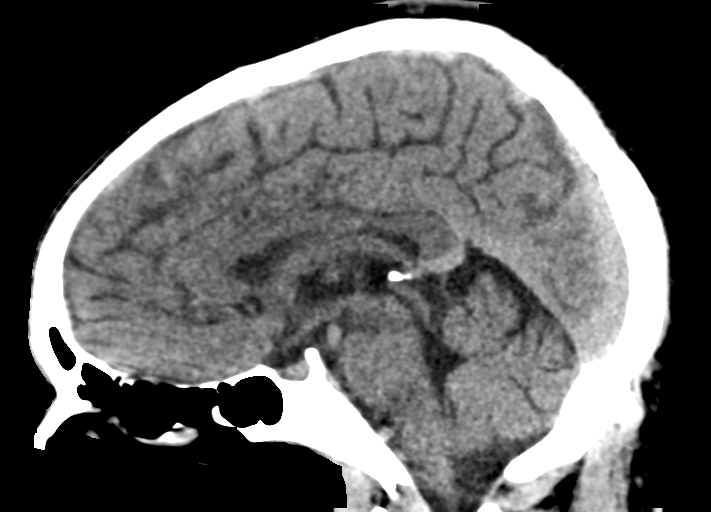
[im 45/67  brain]
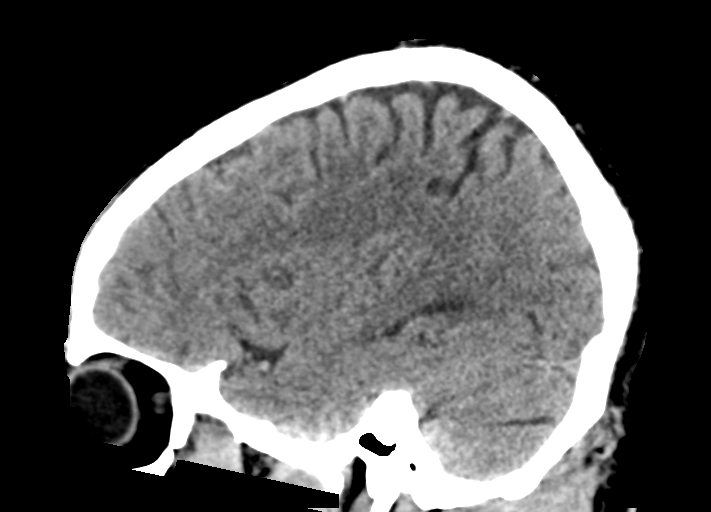

[16 of 47 positions shown; findings below may reference images not displayed]

FINDINGS: Brain: No evidence of acute infarction, hemorrhage, hydrocephalus,
extra-axial collection or mass lesion/mass effect.

Vascular: Negative for hyperdense vessel

Skull: Right pterional craniotomy. Craniotomy flap in good position.
No acute bony abnormality.

Sinuses/Orbits: Negative

Other: None
IMPRESSION: No acute intracranial abnormality

Prior right sided craniotomy for subdural drainage. No recurrent
subdural hematoma.
